# Patient Record
Sex: Male | Born: 1990 | Race: White | Hispanic: No | Marital: Married | Smoking: Current every day smoker
Health system: Southern US, Community
[De-identification: ages and names within clinical notes are randomized; demographics above are authoritative.]

## PROBLEM LIST (undated history)

## (undated) DIAGNOSIS — Z800 Family history of malignant neoplasm of digestive organs: Secondary | ICD-10-CM

## (undated) DIAGNOSIS — Z806 Family history of leukemia: Secondary | ICD-10-CM

## (undated) DIAGNOSIS — F419 Anxiety disorder, unspecified: Secondary | ICD-10-CM

## (undated) DIAGNOSIS — Z8042 Family history of malignant neoplasm of prostate: Secondary | ICD-10-CM

## (undated) HISTORY — DX: Family history of leukemia: Z80.6

## (undated) HISTORY — PX: OTHER SURGICAL HISTORY: SHX169

## (undated) HISTORY — DX: Anxiety disorder, unspecified: F41.9

## (undated) HISTORY — DX: Family history of malignant neoplasm of digestive organs: Z80.0

## (undated) HISTORY — DX: Family history of malignant neoplasm of prostate: Z80.42

---
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
## 2009-05-19 ENCOUNTER — Emergency Department: Payer: Self-pay | Admitting: Emergency Medicine

## 2009-05-22 ENCOUNTER — Emergency Department: Payer: Self-pay

## 2009-08-17 ENCOUNTER — Emergency Department: Payer: Self-pay | Admitting: Internal Medicine

## 2009-10-22 ENCOUNTER — Emergency Department: Payer: Self-pay | Admitting: Emergency Medicine

## 2009-10-29 ENCOUNTER — Emergency Department: Payer: Self-pay | Admitting: Emergency Medicine

## 2010-06-15 ENCOUNTER — Encounter (INDEPENDENT_AMBULATORY_CARE_PROVIDER_SITE_OTHER): Payer: Self-pay | Admitting: *Deleted

## 2010-07-25 ENCOUNTER — Ambulatory Visit: Payer: Self-pay | Admitting: Gastroenterology

## 2010-11-10 ENCOUNTER — Emergency Department: Payer: Self-pay | Admitting: Emergency Medicine

## 2010-11-28 NOTE — Letter (Signed)
Summary: New Patient letter  [facility]  [address] [place], [place] [address]   Phone: [phone]  Fax: [phone]       06/15/2010 MRN: [id number]  [facility] [address] [place], [place]  [address]  Dear Mr. [facility],  Welcome to the Gastroenterology Division at [facility].    You are scheduled to see Dr. Jarold Motto on 07/25/2010 at 10:00AM on the 3rd floor at [facility], [address]  We ask that you try to arrive at our office 15 minutes prior to your appointment time to allow for check-in.  We would like you to complete the enclosed self-administered evaluation form prior to your visit and bring it with you on the day of your appointment.  We will review it with you.  Also, please bring a complete list of all your medications or, if you prefer, bring the medication bottles and we will list them.  Please bring your insurance card so that we may make a copy of it.  If your insurance requires a referral to see a specialist, please bring your referral form from your primary care physician.  Co-payments are due at the time of your visit and may be paid by cash, check or credit card.     Your office visit will consist of a consult with your physician (includes a physical exam), any laboratory testing he/she may order, scheduling of any necessary diagnostic testing (e.g. x-ray, ultrasound, CT-scan), and scheduling of a procedure (e.g. Endoscopy, Colonoscopy) if required.  Please allow enough time on your schedule to allow for any/all of these possibilities.    If you cannot keep your appointment, please call [phone] to cancel or reschedule prior to your appointment date.  This allows [place] the opportunity to schedule an appointment for another patient in need of care.  If you do not cancel or reschedule by 5 p.m. the business day prior to your appointment date, you will be charged a $50.00 late cancellation/no-show fee.    Thank you for  choosing [facility] for your medical needs.  We appreciate the opportunity to care for you.  Please visit [place] at our website  to learn more about our practice.                     Sincerely,                                                             The Gastroenterology Division

## 2013-01-20 ENCOUNTER — Emergency Department: Payer: Self-pay | Admitting: Emergency Medicine

## 2013-01-20 LAB — CBC
HCT: 44 % (ref 40.0–52.0)
MCHC: 35.2 g/dL (ref 32.0–36.0)
MCV: 83 fL (ref 80–100)
Platelet: 261 10*3/uL (ref 150–440)
RBC: 5.28 10*6/uL (ref 4.40–5.90)
RDW: 12.4 % (ref 11.5–14.5)
WBC: 11.6 10*3/uL — ABNORMAL HIGH (ref 3.8–10.6)

## 2013-01-20 LAB — BASIC METABOLIC PANEL
Anion Gap: 4 — ABNORMAL LOW (ref 7–16)
Chloride: 108 mmol/L — ABNORMAL HIGH (ref 98–107)
EGFR (African American): >60
Glucose: 146 mg/dL — ABNORMAL HIGH (ref 65–99)
Osmolality: 280 (ref 275–301)
Potassium: 3.4 mmol/L — ABNORMAL LOW (ref 3.5–5.1)
Sodium: 139 mmol/L (ref 136–145)

## 2013-01-20 LAB — CK TOTAL AND CKMB (NOT AT ARMC): CK-MB: 0.6 ng/mL (ref 0.5–3.6)

## 2013-01-21 LAB — TROPONIN I: Troponin-I: <0.02 ng/mL

## 2013-07-11 ENCOUNTER — Emergency Department: Payer: Self-pay | Admitting: Emergency Medicine

## 2013-07-11 LAB — COMPREHENSIVE METABOLIC PANEL
Anion Gap: 7 (ref 7–16)
BUN: 11 mg/dL (ref 7–18)
Bilirubin,Total: 0.6 mg/dL (ref 0.2–1.0)
Calcium, Total: 8.8 mg/dL (ref 8.5–10.1)
Chloride: 107 mmol/L (ref 98–107)
Creatinine: 0.86 mg/dL (ref 0.60–1.30)
EGFR (African American): >60
EGFR (Non-African Amer.): >60
Glucose: 171 mg/dL — ABNORMAL HIGH (ref 65–99)
Osmolality: 275 (ref 275–301)
Potassium: 3.7 mmol/L (ref 3.5–5.1)
SGOT(AST): 32 U/L (ref 15–37)
SGPT (ALT): 54 U/L (ref 12–78)

## 2013-07-11 LAB — CBC
HCT: 43.6 % (ref 40.0–52.0)
HGB: 15.4 g/dL (ref 13.0–18.0)
MCH: 29.4 pg (ref 26.0–34.0)
MCHC: 35.2 g/dL (ref 32.0–36.0)
RBC: 5.23 10*6/uL (ref 4.40–5.90)

## 2013-07-11 LAB — URINALYSIS, COMPLETE
Bacteria: NONE SEEN
Bilirubin,UR: NEGATIVE
Glucose,UR: NEGATIVE mg/dL (ref 0–75)
Nitrite: NEGATIVE
Ph: 6 (ref 4.5–8.0)
Specific Gravity: 1.026 (ref 1.003–1.030)
Squamous Epithelial: 1
WBC UR: 1 /HPF (ref 0–5)

## 2013-07-12 ENCOUNTER — Emergency Department: Payer: Self-pay | Admitting: Emergency Medicine

## 2013-07-13 LAB — COMPREHENSIVE METABOLIC PANEL
Alkaline Phosphatase: 74 U/L (ref 50–136)
BUN: 9 mg/dL (ref 7–18)
Bilirubin,Total: 1.1 mg/dL — ABNORMAL HIGH (ref 0.2–1.0)
Co2: 26 mmol/L (ref 21–32)
EGFR (Non-African Amer.): >60
Glucose: 84 mg/dL (ref 65–99)
SGOT(AST): 48 U/L — ABNORMAL HIGH (ref 15–37)
SGPT (ALT): 52 U/L (ref 12–78)
Sodium: 135 mmol/L — ABNORMAL LOW (ref 136–145)

## 2013-07-13 LAB — CBC
HCT: 44.2 % (ref 40.0–52.0)
HGB: 15.6 g/dL (ref 13.0–18.0)
MCV: 84 fL (ref 80–100)
RBC: 5.24 10*6/uL (ref 4.40–5.90)
RDW: 12.3 % (ref 11.5–14.5)
WBC: 13.7 10*3/uL — ABNORMAL HIGH (ref 3.8–10.6)

## 2014-01-04 ENCOUNTER — Ambulatory Visit: Payer: Self-pay

## 2014-08-23 ENCOUNTER — Ambulatory Visit: Payer: Self-pay | Admitting: Gastroenterology

## 2014-10-27 ENCOUNTER — Ambulatory Visit: Payer: Self-pay | Admitting: Physician Assistant

## 2015-12-25 IMAGING — RF DG UGI W/ KUB
2 series · 15 of 17 positions shown · non-contrast
Comparison: None.

FLUOROSCOPY TIME:  0 min, 56 seconds.

CLINICAL DATA: Gastroesophageal reflux, relieved with meds

EXAM:
UPPER GI SERIES
TECHNIQUE: A routine upper GI series was performed using thin and high density
barium. Gas-forming crystals and a 12 mm barium tablet were also
administered

[Series 1: fluoro_barium singleshot_bw · 0.17mm/px · 11 of 13 slices shown]
[im 1/13]
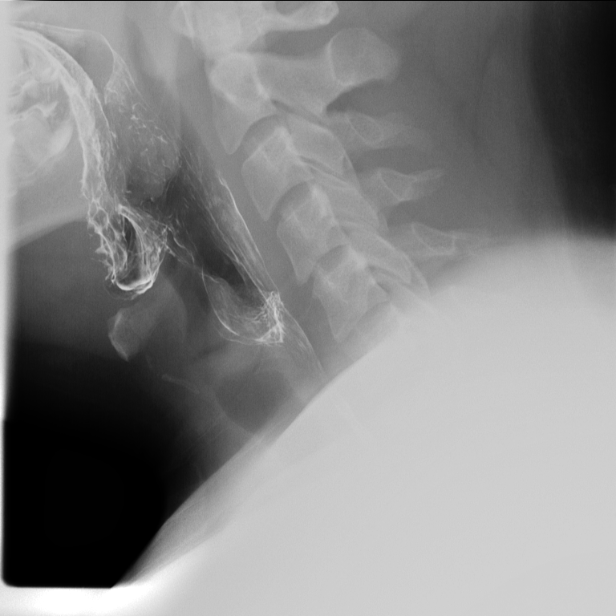
[im 2/13]
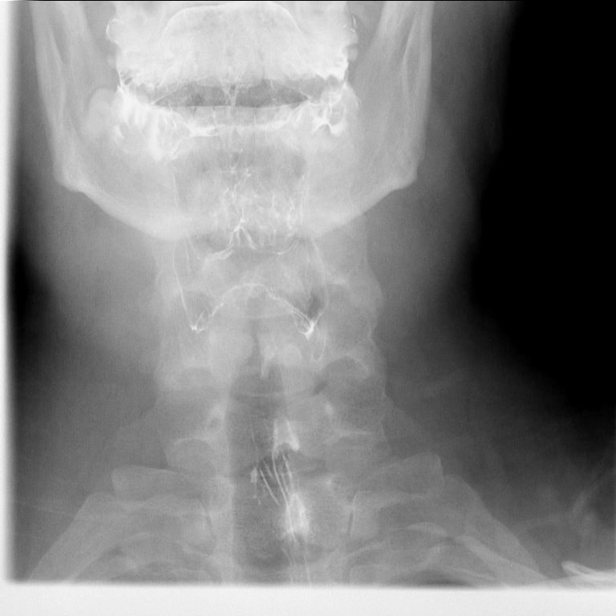
[im 3/13]
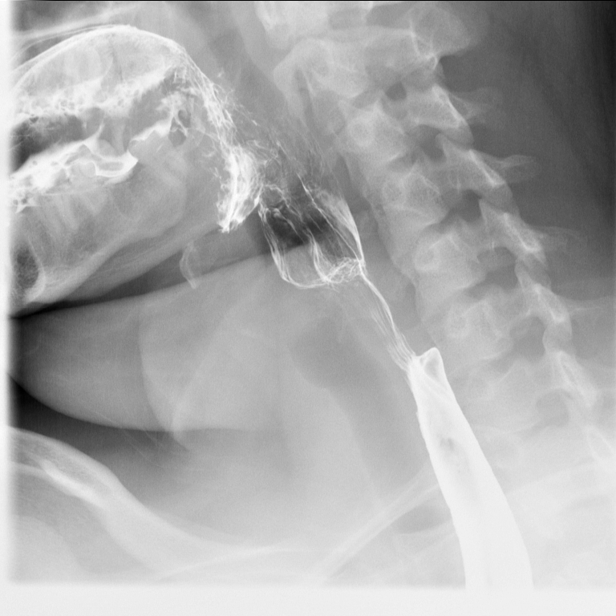
[im 4/13]
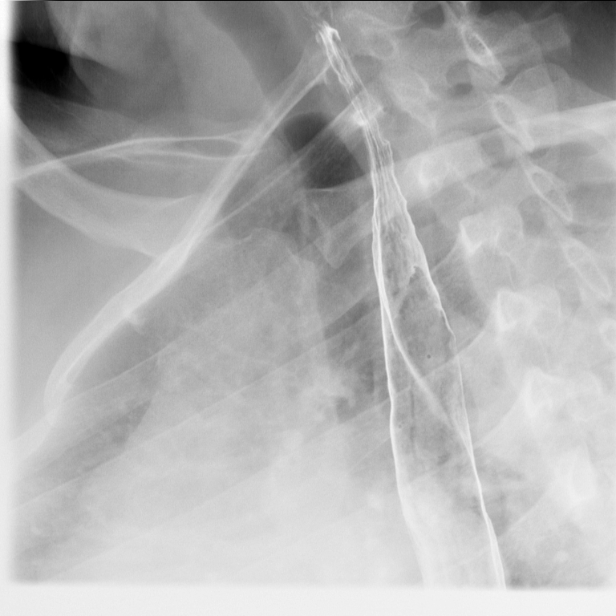
[im 6/13]
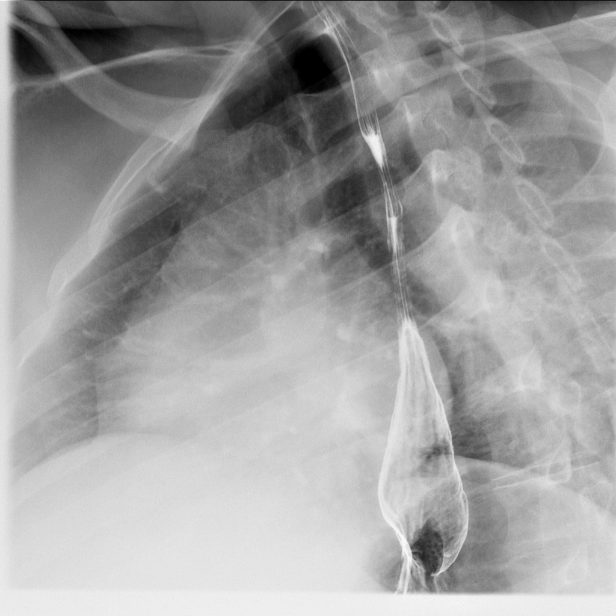
[im 7/13]
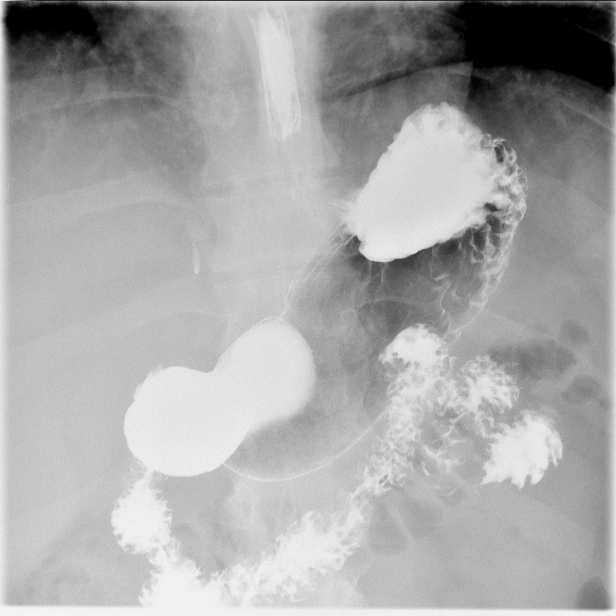
[im 8/13]
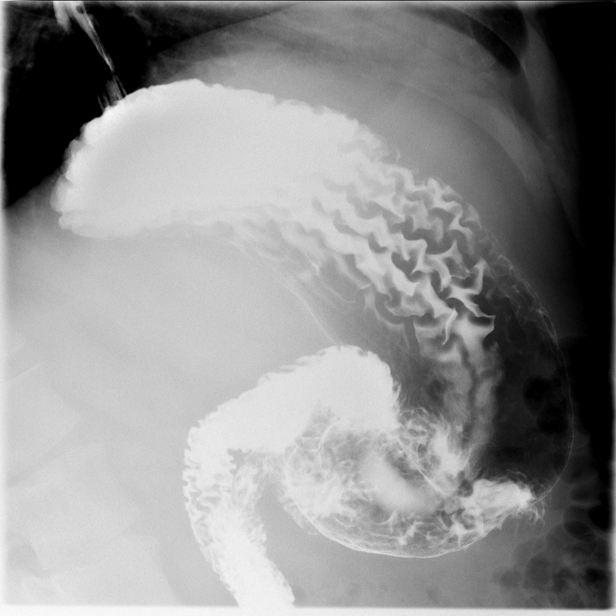
[im 9/13]
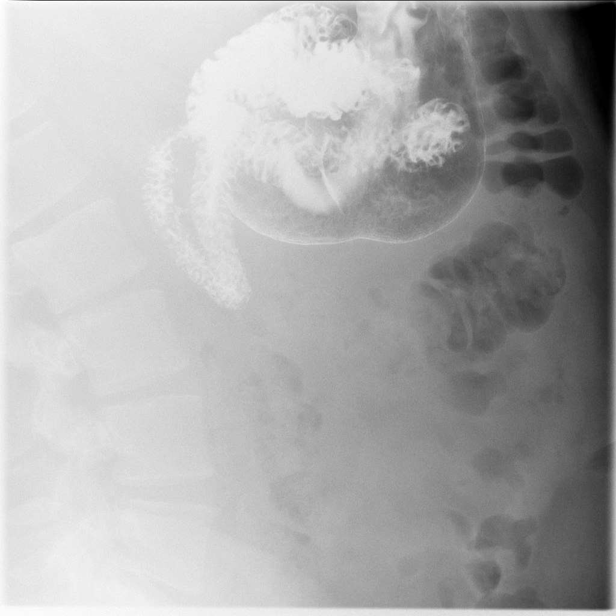
[im 10/13]
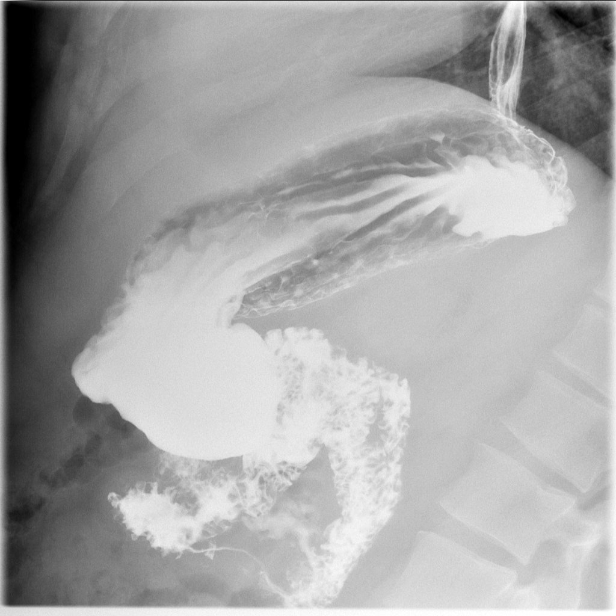
[im 11/13]
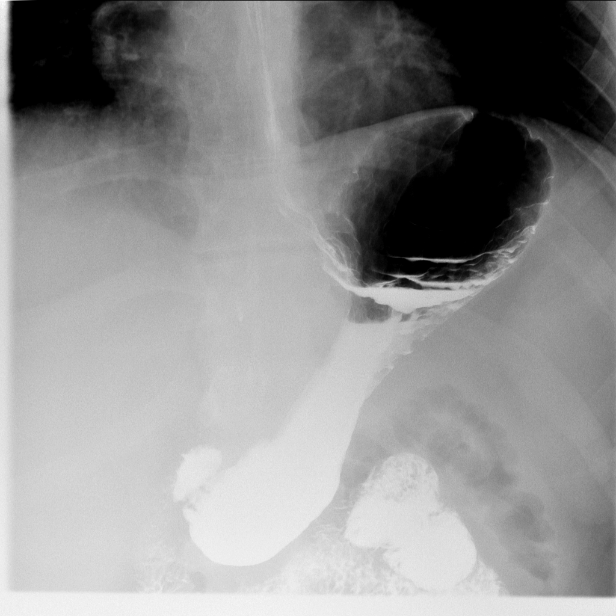
[im 12/13]
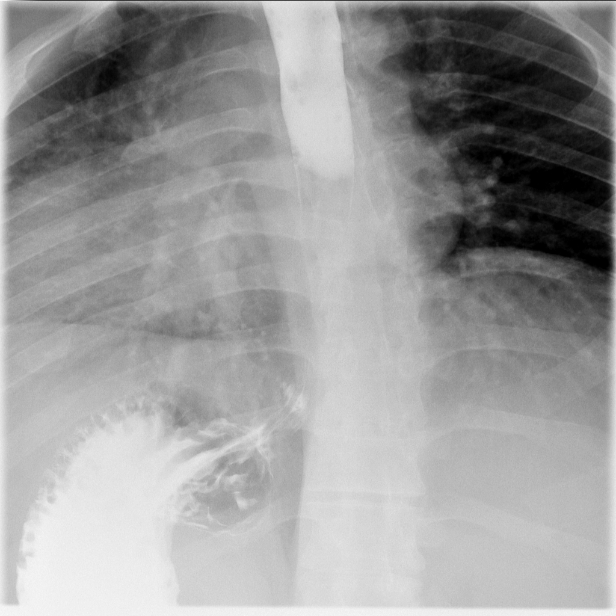

[Series 14: fluoro_barium 2fps_bw · 0.19mm/px · 4 of 13 frames shown]
[frame 1/13]
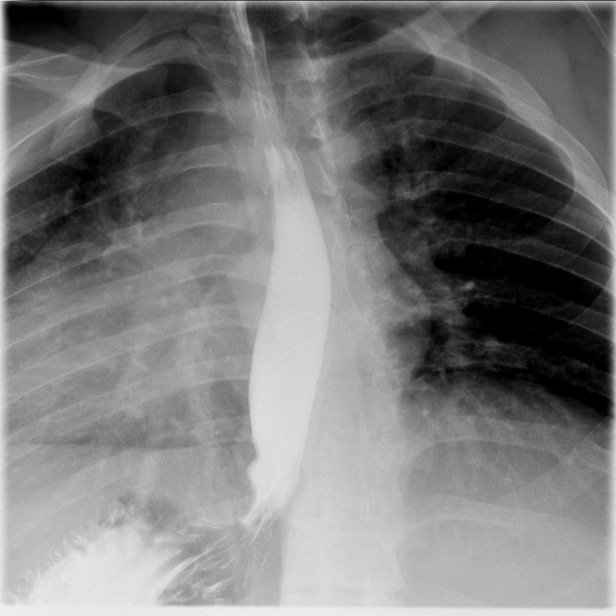
[frame 2/13]
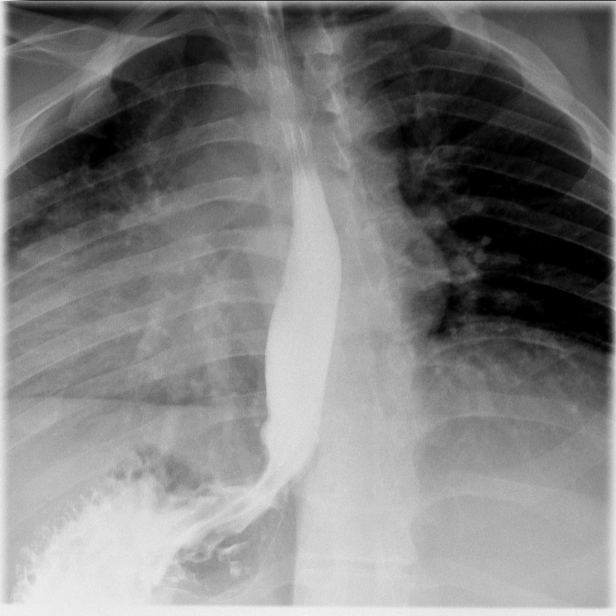
[frame 7/13]
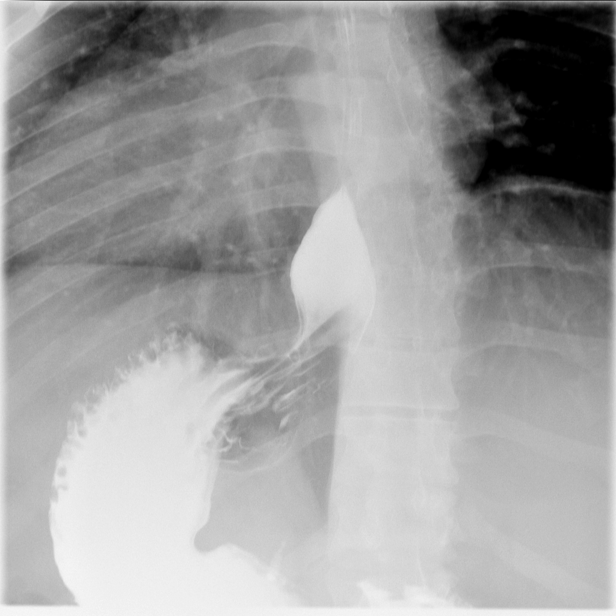
[frame 12/13]
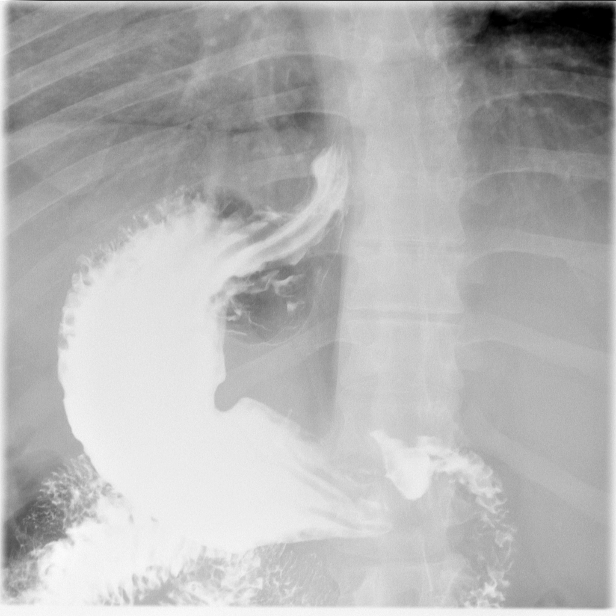

[15 of 17 positions shown; findings below may reference images not displayed]

FINDINGS: The cervical esophagus was normal in a survey fashion. The thoracic
esophagus distended well. No fixed stricture was demonstrated. A
small reducible hiatal hernia was demonstrated. A small amount of
gastroesophageal reflux was also demonstrated.

The stomach distended well. The mucosal fold pattern was normal. The
duodenum bulb and C sweep were normal in appearance. The 12 mm
barium pill passed without difficulty.
IMPRESSION: 1. There is a small reducible hiatal hernia with a small amount of
gastroesophageal reflux. There is no evidence of a stricture or
esophagitis.
2. The stomach and duodenum are normal in appearance.

## 2016-08-17 ENCOUNTER — Ambulatory Visit
Admission: RE | Admit: 2016-08-17 | Discharge: 2016-08-17 | Disposition: A | Discharge status: Home / Self Care | Payer: Managed Care, Other (non HMO) | Source: Ambulatory Visit | Attending: Nurse Practitioner | Admitting: Nurse Practitioner

## 2016-08-17 ENCOUNTER — Other Ambulatory Visit: Payer: Self-pay | Admitting: Nurse Practitioner

## 2016-08-17 DIAGNOSIS — M542 Cervicalgia: Secondary | ICD-10-CM

## 2017-07-26 ENCOUNTER — Ambulatory Visit
Admission: EM | Admit: 2017-07-26 | Discharge: 2017-07-26 | Disposition: A | Discharge status: Home / Self Care | Payer: 59 | Attending: Family Medicine | Admitting: Family Medicine

## 2017-07-26 DIAGNOSIS — R05 Cough: Secondary | ICD-10-CM

## 2017-07-26 DIAGNOSIS — R0602 Shortness of breath: Secondary | ICD-10-CM | POA: Diagnosis not present

## 2017-07-26 DIAGNOSIS — J069 Acute upper respiratory infection, unspecified: Secondary | ICD-10-CM

## 2017-07-26 MED ORDER — BENZONATATE 200 MG PO CAPS: ORAL_CAPSULE | ORAL | 0 refills | Status: DC

## 2017-07-26 MED ORDER — HYDROCOD POLST-CPM POLST ER 10-8 MG/5ML PO SUER: 5.0000 mL | Freq: Two times a day (BID) | ORAL | 0 refills | Status: DC

## 2017-07-26 MED ORDER — ALBUTEROL SULFATE HFA 108 (90 BASE) MCG/ACT IN AERS: 1.0000 | INHALATION_SPRAY | Freq: Four times a day (QID) | RESPIRATORY_TRACT | 0 refills | Status: DC | PRN

## 2017-07-26 NOTE — ED Triage Notes (Signed)
URI sx x 3 days, and worsening. Pt c/o dry cough, dyspnea, nasal congestion, PND, frontal sinus pain.

## 2017-07-26 NOTE — ED Provider Notes (Addendum)
[facility]    CSN: [id number] Arrival date & time: 07/26/17  1836     History   Chief Complaint Chief Complaint  Patient presents with  . Cough    HPI Alfred Gross is a 26 y.o. male.   HPI  26 year old male who presents with a three-day history of upper respiratory symptoms. He has been worsening today. He has had a nonproductive cough and dyspnea worse today nasal congestion breathing at nighttime and frontal sinus pain. He usually catches this from his children. He states invariably will go into bronchitis and his primary care physician usually prescribes antibiotics. I Explained to him that we do not routinely prescribe antibiotics for an upper respiratory infection that will run its course and improve. If it does not he should follow-up with his primary care physician        History reviewed. No pertinent past medical history.  There are no active problems to display for this patient.   History reviewed. No pertinent surgical history.     Home Medications    Prior to Admission medications   Medication Sig Start Date End Date Taking? Authorizing Provider  citalopram (CELEXA) 10 MG tablet Take 10 mg by mouth daily.   Yes [provider]  omeprazole (PRILOSEC) 40 MG capsule Take 40 mg by mouth daily.   Yes [provider]  albuterol (PROVENTIL HFA;VENTOLIN HFA) 108 (90 Base) MCG/ACT inhaler Inhale 1-2 puffs into the lungs every 6 (six) hours as needed for wheezing or shortness of breath. Use with spacer 07/26/17   Lutricia Feil, PA-C  benzonatate (TESSALON) 200 MG capsule Take one cap TID PRN cough 07/26/17   Lutricia Feil, PA-C  chlorpheniramine-HYDROcodone [facility]) 10-8 MG/5ML SUER Take 5 mLs by mouth 2 (two) times daily. 07/26/17   Lutricia Feil, PA-C    Family History Family History  Problem Relation Age of Onset  . Healthy Mother   . Healthy Father     Social History Social History    Substance Use Topics  . Smoking status: Former Smoker    Packs/day: 1.00    Years: 10.00    Quit date: 11/28/2016  . Smokeless tobacco: Never Used  . Alcohol use No     Allergies   Penicillins   Review of Systems Review of Systems  Constitutional: Positive for activity change. Negative for appetite change, chills, fatigue and fever.  HENT: Positive for congestion, postnasal drip and rhinorrhea.   Respiratory: Positive for cough and shortness of breath.   All other systems reviewed and are negative.    Physical Exam Triage Vital Signs ED Triage Vitals  Enc Vitals Group     BP 07/26/17 1927 130/80     Pulse Rate 07/26/17 1927 84     Resp 07/26/17 1927 16     Temp 07/26/17 1927 98.5 F (36.9 C)     Temp Source 07/26/17 1927 Oral     SpO2 07/26/17 1927 98 %     Weight 07/26/17 1928 228 lb (103.4 kg)     Height 07/26/17 1928  (1.753 m)     Head Circumference --      Peak Flow --      Pain Score --      Pain Loc --      Pain Edu? --      Excl. in GC? --    No data found.   Updated Vital Signs BP 130/80 (BP Location: Left Arm)  Pulse 84   Temp 98.5 F (36.9 C) (Oral)   Resp 16   Ht  (1.753 m)   Wt 228 lb (103.4 kg)   SpO2 98%   BMI 33.67 kg/m   Visual Acuity Right Eye Distance:   Left Eye Distance:   Bilateral Distance:    Right Eye Near:   Left Eye Near:    Bilateral Near:     Physical Exam  Constitutional: He is oriented to person, place, and time. He appears well-developed and well-nourished. No distress.  HENT:  Head: Normocephalic.  Right Ear: External ear normal.  Left Ear: External ear normal.  Nose: Nose normal.  Mouth/Throat: Oropharynx is clear and moist. No oropharyngeal exudate.  Eyes: Pupils are equal, round, and reactive to light. Right eye exhibits no discharge. Left eye exhibits no discharge.  Neck: Normal range of motion.  Pulmonary/Chest: Effort normal and breath sounds normal. He has no wheezes. He has no rales. He  exhibits no tenderness.  Musculoskeletal: Normal range of motion.  Lymphadenopathy:    He has no cervical adenopathy.  Neurological: He is alert and oriented to person, place, and time.  Skin: Skin is warm and dry. He is not diaphoretic.  Psychiatric: He has a normal mood and affect. His behavior is normal. Judgment and thought content normal.  Nursing note and vitals reviewed.    UC Treatments / Results  Labs (all labs ordered are listed, but only abnormal results are displayed) Labs Reviewed - No data to display  EKG  EKG Interpretation None       Radiology No results found.  Procedures Procedures (including critical care time)  Medications Ordered in UC Medications - No data to display   Initial Impression / Assessment and Plan / UC Course  I have reviewed the triage vital signs and the nursing notes.  Pertinent labs & imaging results that were available during my care of the patient were reviewed by me and considered in my medical decision making (see chart for details).    Plan: 1. Test/x-ray results and diagnosis reviewed with patient 2. rx as per orders; risks, benefits, potential side effects reviewed with patient 3. Recommend supportive treatment with Flonase nasal spray for 3 weeks to help promote drainage. Also placed him on an albuterol inhaler with spacer for the shortness of breath. Provide him with the [facility] and Tussionex that he may take at nighttime. Advised him that if he does have cough that persists or becomes more productive runs high fevers he should follow-up with his primary care physician 4. F/u prn if symptoms worsen or don't improve    Final Clinical Impressions(s) / UC Diagnoses   Final diagnoses:  Acute upper respiratory infection    New Prescriptions Discharge Medication List as of 07/26/2017  8:06 PM    START taking these medications   Details  albuterol (PROVENTIL HFA;VENTOLIN HFA) 108 (90 Base) MCG/ACT inhaler Inhale  1-2 puffs into the lungs every 6 (six) hours as needed for wheezing or shortness of breath. Use with spacer, Starting Fri 07/26/2017, Normal    benzonatate (TESSALON) 200 MG capsule Take one cap TID PRN cough, Normal    chlorpheniramine-HYDROcodone (TUSSIONEX PENNKINETIC ER) 10-8 MG/5ML SUER Take 5 mLs by mouth 2 (two) times daily., Starting Fri 07/26/2017, Print         Controlled Substance Prescriptions [place] Controlled Substance Registry consulted? Not Applicable   Lutricia Feil, PA-C 07/26/17 2016    Lutricia Feil, PA-C 07/26/17 2019

## 2017-10-16 ENCOUNTER — Ambulatory Visit: Payer: 59 | Admitting: Nurse Practitioner

## 2017-10-16 ENCOUNTER — Encounter: Payer: Self-pay | Admitting: Nurse Practitioner

## 2017-10-16 VITALS — BP 122/61 | HR 113 | Temp 99.5°F | Resp 16 | Ht 69.0 in | Wt 238.4 lb

## 2017-10-16 DIAGNOSIS — R062 Wheezing: Secondary | ICD-10-CM

## 2017-10-16 DIAGNOSIS — J069 Acute upper respiratory infection, unspecified: Secondary | ICD-10-CM

## 2017-10-16 MED ORDER — LEVOFLOXACIN 500 MG PO TABS: 500.0000 mg | ORAL_TABLET | Freq: Every day | ORAL | 0 refills | Status: DC

## 2017-10-16 MED ORDER — PREDNISONE 10 MG PO TABS: ORAL_TABLET | ORAL | 0 refills | Status: DC

## 2017-10-16 NOTE — Progress Notes (Signed)
Subjective:     Patient ID: Alfred CenterChristopher Gross, male   DOB: 30-May-1991, 26 y.o.   MRN: [id number]  The patient c/o nasal congestion, sinus pain, low grade fever, and cough. States that his boss had same symptoms last week and did not stay home at all. Symptoms have been present for a few days. Nothing has helped, and he has taken OTC meds to help.    Current Meds  Medication Sig  . citalopram (CELEXA) 10 MG tablet Take 10 mg by mouth daily.  . clotrimazole-betamethasone (LOTRISONE) cream Apply 1 application topically 2 (two) times daily. Apply extrernaly bid to feet prn  . fluticasone (FLONASE) 50 MCG/ACT nasal spray Place 1 spray into both nostrils daily.  Marland Kitchen. omeprazole (PRILOSEC) 40 MG capsule Take 40 mg by mouth daily.    Review of Systems  Constitutional: Positive for fatigue and fever. Negative for activity change, appetite change and chills.  HENT: Positive for congestion, postnasal drip, rhinorrhea, sinus pressure and sinus pain.   Eyes: Negative.   Respiratory: Positive for cough and chest tightness.   Cardiovascular: Negative.   Gastrointestinal: Negative.   Endocrine: Negative.   Genitourinary: Negative.   Musculoskeletal: Negative.   Neurological: Negative for headaches.  Hematological: Negative.   Psychiatric/Behavioral: Negative.    Today's Vitals   10/16/17 1459  BP: 122/61  Pulse: (!) 113  Resp: 16  Temp: 99.5 F (37.5 C)  SpO2: 94%  Weight: 238 lb 6.4 oz (108.1 kg)  Height: 5\' 9"  (1.753 m)      Objective:   Physical Exam  Constitutional: He is oriented to person, place, and time. He appears well-developed and well-nourished.  HENT:  Head: Normocephalic and atraumatic.  Right Ear: There is swelling and tenderness. Tympanic membrane is bulging.  Left Ear: There is swelling and tenderness. Tympanic membrane is bulging.  Nose: Rhinorrhea present. Right sinus exhibits frontal sinus tenderness. Left sinus exhibits frontal sinus tenderness.  Mouth/Throat:  Uvula is midline. Posterior oropharyngeal erythema present.  Neck: Normal range of motion. Neck supple.  Cardiovascular: Normal rate and regular rhythm.  Pulmonary/Chest: Effort normal. No respiratory distress. He has wheezes.  Abdominal: Soft. There is no tenderness.  Musculoskeletal: Normal range of motion.  Lymphadenopathy:    He has no cervical adenopathy.  Neurological: He is alert and oriented to person, place, and time.  Skin: Skin is warm and dry.  Psychiatric: He has a normal mood and affect. His behavior is normal.       Assessment:     Upper respiratory infection, acute - Plan: levofloxacin (LEVAQUIN) 500 MG tablet, predniSONE (DELTASONE) 10 MG tablet  Wheezing     Plan:     1. URI - start levofloxacin 500mg  daily for 7 days. OTC medications to help relieve symptoms. Work note given for today and tomorrow.  2. Wheezing - rescue inhaler as previously prescribed. Prednisone 10mg  dose pack - take as directed for 6 days.   Follow up as needed and as scheduled

## 2017-10-16 NOTE — Patient Instructions (Addendum)

## 2017-11-11 ENCOUNTER — Other Ambulatory Visit: Payer: Self-pay

## 2017-11-11 MED ORDER — OMEPRAZOLE 40 MG PO CPDR: 40.0000 mg | DELAYED_RELEASE_CAPSULE | Freq: Every day | ORAL | 1 refills | Status: DC

## 2017-11-11 MED ORDER — CITALOPRAM HYDROBROMIDE 10 MG PO TABS: 10.0000 mg | ORAL_TABLET | Freq: Every day | ORAL | 3 refills | Status: DC

## 2017-11-15 ENCOUNTER — Other Ambulatory Visit: Payer: Self-pay

## 2017-11-15 DIAGNOSIS — J069 Acute upper respiratory infection, unspecified: Secondary | ICD-10-CM

## 2017-11-15 MED ORDER — LEVOFLOXACIN 500 MG PO TABS: 500.0000 mg | ORAL_TABLET | Freq: Every day | ORAL | 0 refills | Status: DC

## 2017-11-15 MED ORDER — PREDNISONE 10 MG PO TABS: ORAL_TABLET | ORAL | 0 refills | Status: DC

## 2017-11-15 NOTE — Telephone Encounter (Signed)
Pt called that not feeling still coughing  And sinus infection and as per heather send prednisone and levaquin

## 2018-01-23 ENCOUNTER — Ambulatory Visit: Payer: Self-pay | Admitting: Nurse Practitioner

## 2018-02-17 ENCOUNTER — Ambulatory Visit: Payer: 59 | Admitting: Nurse Practitioner

## 2018-02-17 VITALS — BP 127/78 | HR 86 | Temp 98.3°F | Resp 16 | Ht 69.0 in | Wt 230.4 lb

## 2018-02-17 DIAGNOSIS — R062 Wheezing: Secondary | ICD-10-CM

## 2018-02-17 DIAGNOSIS — R6889 Other general symptoms and signs: Secondary | ICD-10-CM

## 2018-02-17 DIAGNOSIS — J069 Acute upper respiratory infection, unspecified: Secondary | ICD-10-CM

## 2018-02-17 LAB — POCT INFLUENZA A/B
INFLUENZA A, POC: NEGATIVE
INFLUENZA B, POC: NEGATIVE

## 2018-02-17 MED ORDER — ALBUTEROL SULFATE HFA 108 (90 BASE) MCG/ACT IN AERS: 2.0000 | INHALATION_SPRAY | Freq: Four times a day (QID) | RESPIRATORY_TRACT | 2 refills | Status: DC | PRN

## 2018-02-17 MED ORDER — LEVOFLOXACIN 500 MG PO TABS: 500.0000 mg | ORAL_TABLET | Freq: Every day | ORAL | 0 refills | Status: DC

## 2018-02-17 MED ORDER — PREDNISONE 10 MG (21) PO TBPK: ORAL_TABLET | ORAL | 0 refills | Status: DC

## 2018-02-17 NOTE — Progress Notes (Signed)
[facility] [address] [place], [place] [address]  Internal MEDICINE  Office Visit Note  Patient Name: Alfred Gross  [id number]  [id number]  Date of Service: 03/05/2018   Pt is here for a sick visit.  Chief Complaint  Patient presents with  . Nasal Congestion  . Cough    dry hacking cough   . Fever    taking ibuprofen, no fever today,started last thursday  . Ear Pain    left ear      The patient is here for sick visit. He is c/o nasal congestion, sinus pain, low grade fever, and cough. States that his boss had same symptoms last week and did not stay home at all. Symptoms have been present for a few days. Nothing has helped, and he has taken OTC meds to help.    Current Medication:  Outpatient Encounter Medications as of 02/17/2018  Medication Sig  . cetirizine (ZYRTEC) 10 MG tablet Take 10 mg by mouth daily.  . citalopram (CELEXA) 10 MG tablet Take 1 tablet (10 mg total) by mouth daily.  Marland Kitchen albuterol (PROVENTIL HFA;VENTOLIN HFA) 108 (90 Base) MCG/ACT inhaler Inhale 2 puffs into the lungs every 6 (six) hours as needed for wheezing or shortness of breath.  . clotrimazole-betamethasone (LOTRISONE) cream Apply 1 application topically 2 (two) times daily. Apply extrernaly bid to feet prn  . fluticasone (FLONASE) 50 MCG/ACT nasal spray Place 1 spray into both nostrils daily.  Marland Kitchen omeprazole (PRILOSEC) 40 MG capsule Take 1 capsule (40 mg total) by mouth daily.  . [DISCONTINUED] levofloxacin (LEVAQUIN) 500 MG tablet Take 1 tablet (500 mg total) by mouth daily.  . [DISCONTINUED] levofloxacin (LEVAQUIN) 500 MG tablet Take 1 tablet (500 mg total) by mouth daily.  . [DISCONTINUED] predniSONE (DELTASONE) 10 MG tablet 6 day dose pack - take as directed for 6 days (Patient not taking: Reported on 02/17/2018)  . [DISCONTINUED] predniSONE (STERAPRED UNI-PAK 21 TAB) 10 MG (21) TBPK tablet 6 day taper - take by mouth as directed for 6 days   No facility-administered  encounter medications on file as of 02/17/2018.       Medical History: Past Medical History:  Diagnosis Date  . Anxiety      Today's Vitals   02/17/18 1502  BP: 127/78  Pulse: 86  Resp: 16  Temp: 98.3 F (36.8 C)  SpO2: 97%  Weight: 230 lb 6.4 oz (104.5 kg)  Height: 5\' 9"  (1.753 m)    Review of Systems  Constitutional: Positive for chills, fatigue and fever. Negative for activity change and appetite change.  HENT: Positive for congestion, postnasal drip, rhinorrhea, sinus pressure, sinus pain and sore throat.   Eyes: Negative.   Respiratory: Positive for cough and chest tightness.   Cardiovascular: Negative for chest pain and palpitations.  Gastrointestinal: Positive for nausea.  Endocrine: Negative for cold intolerance, heat intolerance, polydipsia, polyphagia and polyuria.  Genitourinary: Negative.   Allergic/Immunologic: Positive for environmental allergies.  Neurological: Positive for headaches.  Hematological: Positive for adenopathy.  Psychiatric/Behavioral: Negative for behavioral problems and dysphoric mood. The patient is not nervous/anxious.     Physical Exam  Constitutional: He is oriented to person, place, and time. He appears well-developed and well-nourished. No distress.  HENT:  Head: Normocephalic and atraumatic.  Right Ear: Tympanic membrane is erythematous and bulging.  Left Ear: Tympanic membrane is erythematous and bulging.  Nose: Rhinorrhea present. Right sinus exhibits maxillary sinus tenderness and frontal sinus tenderness. Left sinus exhibits maxillary sinus tenderness and frontal  sinus tenderness.  Mouth/Throat: Posterior oropharyngeal erythema present. No oropharyngeal exudate.  Eyes: Pupils are equal, round, and reactive to light. EOM are normal.  Neck: Normal range of motion. Neck supple. No JVD present. No tracheal deviation present. No thyromegaly present.  Cardiovascular: Normal rate, regular rhythm and normal heart sounds. Exam reveals  no gallop and no friction rub.  No murmur heard. Pulmonary/Chest: Effort normal and breath sounds normal. No respiratory distress. He has no wheezes. He has no rales. He exhibits no tenderness.  Congested, non-productive cough present.   Abdominal: Soft. Bowel sounds are normal.  Musculoskeletal: Normal range of motion.  Lymphadenopathy:    He has cervical adenopathy.  Neurological: He is alert and oriented to person, place, and time. No cranial nerve deficit.  Skin: Skin is warm and dry. He is not diaphoretic.  Psychiatric: He has a normal mood and affect. His behavior is normal. Judgment and thought content normal.  Nursing note and vitals reviewed.  Assessment/Plan:  1. Flu-like symptoms - POCT Influenza A/B negative today.   2. Upper respiratory infection, acute Treat with levofloxacin 500mg  daily for 10 days. Add prednisone 6 day dose pack. Use OTC medication to alleviate symptoms.   3. Wheezing - albuterol (PROVENTIL HFA;VENTOLIN HFA) 108 (90 Base) MCG/ACT inhaler; Inhale 2 puffs into the lungs every 6 (six) hours as needed for wheezing or shortness of breath.  Dispense: 1 Inhaler; Refill: 2    General Counseling: Medhansh verbalizes understanding of the findings of todays visit and agrees with plan of treatment. I have discussed any further diagnostic evaluation that may be needed or ordered today. We also reviewed his medications today. he has been encouraged to call the office with any questions or concerns that should arise related to todays visit.  Rest and increase fluids. Continue using OTC medication to control symptoms.   This patient was seen by Vincent GrosHeather Karletta Millay, FNP- C in Collaboration with Dr Lyndon CodeFozia M Khan as a part of collaborative care agreement    Orders Placed This Encounter  Procedures  . POCT Influenza A/B    Meds ordered this encounter  Medications  . albuterol (PROVENTIL HFA;VENTOLIN HFA) 108 (90 Base) MCG/ACT inhaler    Sig: Inhale 2 puffs into the  lungs every 6 (six) hours as needed for wheezing or shortness of breath.    Dispense:  1 Inhaler    Refill:  2    Order Specific Question:   Supervising Provider    Answer:   Lyndon CodeKHAN, FOZIA M [[id number]]  . DISCONTD: levofloxacin (LEVAQUIN) 500 MG tablet    Sig: Take 1 tablet (500 mg total) by mouth daily.    Dispense:  10 tablet    Refill:  0    Order Specific Question:   Supervising Provider    Answer:   Lyndon CodeKHAN, FOZIA M [[id number]]  . DISCONTD: predniSONE (STERAPRED UNI-PAK 21 TAB) 10 MG (21) TBPK tablet    Sig: 6 day taper - take by mouth as directed for 6 days    Dispense:  21 tablet    Refill:  0    Order Specific Question:   Supervising Provider    Answer:   Lyndon CodeKHAN, FOZIA M [[id number]]    Time spent: 15 Minutes

## 2018-03-03 ENCOUNTER — Telehealth: Payer: Self-pay

## 2018-03-03 ENCOUNTER — Other Ambulatory Visit: Payer: Self-pay | Admitting: Nurse Practitioner

## 2018-03-03 DIAGNOSIS — K219 Gastro-esophageal reflux disease without esophagitis: Secondary | ICD-10-CM | POA: Insufficient documentation

## 2018-03-03 DIAGNOSIS — Z720 Tobacco use: Secondary | ICD-10-CM | POA: Insufficient documentation

## 2018-03-03 DIAGNOSIS — F419 Anxiety disorder, unspecified: Secondary | ICD-10-CM | POA: Insufficient documentation

## 2018-03-03 DIAGNOSIS — J069 Acute upper respiratory infection, unspecified: Secondary | ICD-10-CM

## 2018-03-03 MED ORDER — LEVOFLOXACIN 500 MG PO TABS: 500.0000 mg | ORAL_TABLET | Freq: Every day | ORAL | 0 refills | Status: DC

## 2018-03-03 MED ORDER — PREDNISONE 10 MG (48) PO TBPK: ORAL_TABLET | ORAL | 0 refills | Status: DC

## 2018-03-03 NOTE — Telephone Encounter (Signed)
Sent note to heather 

## 2018-03-03 NOTE — Progress Notes (Signed)
Still sick after initial round of antibiotics. Repeat levofloxacin  daly for 10 days. Will have him do prednisone, but for 12 days. New prescriptions were sent to [facility]. Continue OTC medication to help with symptoms.

## 2018-03-03 NOTE — Telephone Encounter (Signed)
Still sick after initial round of antibiotics. Repeat levofloxacin 500mg daly for 10 days. Will have him do prednisone, but for 12 days. New prescriptions were sent to [facility]. Continue OTC medication to help with symptoms.  

## 2018-03-04 ENCOUNTER — Telehealth: Payer: Self-pay

## 2018-03-04 NOTE — Telephone Encounter (Signed)
Pt advised that rx for Levaquin and prednisone sent to pharmacy.  dbs

## 2018-03-05 ENCOUNTER — Encounter: Payer: Self-pay | Admitting: Nurse Practitioner

## 2018-03-05 DIAGNOSIS — J069 Acute upper respiratory infection, unspecified: Secondary | ICD-10-CM | POA: Insufficient documentation

## 2018-03-05 DIAGNOSIS — R6889 Other general symptoms and signs: Secondary | ICD-10-CM | POA: Insufficient documentation

## 2018-03-05 DIAGNOSIS — R062 Wheezing: Secondary | ICD-10-CM | POA: Insufficient documentation

## 2018-06-03 ENCOUNTER — Other Ambulatory Visit: Payer: Self-pay

## 2018-06-03 MED ORDER — OMEPRAZOLE 40 MG PO CPDR: 40.0000 mg | DELAYED_RELEASE_CAPSULE | Freq: Every day | ORAL | 1 refills | Status: DC

## 2018-06-17 ENCOUNTER — Encounter: Payer: Self-pay | Admitting: Adult Health

## 2018-06-17 ENCOUNTER — Ambulatory Visit: Payer: Managed Care, Other (non HMO) | Admitting: Adult Health

## 2018-06-17 VITALS — BP 112/78 | HR 74 | Temp 98.8°F | Resp 16 | Ht 69.0 in | Wt 235.0 lb

## 2018-06-17 DIAGNOSIS — F419 Anxiety disorder, unspecified: Secondary | ICD-10-CM

## 2018-06-17 DIAGNOSIS — J069 Acute upper respiratory infection, unspecified: Secondary | ICD-10-CM

## 2018-06-17 DIAGNOSIS — J0100 Acute maxillary sinusitis, unspecified: Secondary | ICD-10-CM

## 2018-06-17 MED ORDER — AZITHROMYCIN 250 MG PO TABS: ORAL_TABLET | ORAL | 0 refills | Status: DC

## 2018-06-17 NOTE — Patient Instructions (Signed)

## 2018-06-17 NOTE — Progress Notes (Signed)
[facility] [address] [place], [place] [address]  Internal MEDICINE  Office Visit Note  Patient Name: Alfred Gross  1991  [id number]  Date of Service: 07/03/2018  Chief Complaint  Patient presents with  . Sinusitis  . Cough     HPI Pt is here for a sick visit. He reports sob, coughing with yellow colored sputum.  He reports mild body aches, and headaches with sinus pressure for about 5 days.  He denies fever or chills.  He reports his kids and wife have been sick, and now he is also. He reports using Flonase and zyrtec with some relief.     Current Medication:  Outpatient Encounter Medications as of 06/17/2018  Medication Sig  . albuterol (PROVENTIL HFA;VENTOLIN HFA) 108 (90 Base) MCG/ACT inhaler Inhale 2 puffs into the lungs every 6 (six) hours as needed for wheezing or shortness of breath.  . cetirizine (ZYRTEC) 10 MG tablet Take 10 mg by mouth daily.  . clotrimazole-betamethasone (LOTRISONE) cream Apply 1 application topically 2 (two) times daily. Apply extrernaly bid to feet prn  . omeprazole (PRILOSEC) 40 MG capsule Take 1 capsule (40 mg total) by mouth daily.  . [DISCONTINUED] citalopram (CELEXA) 10 MG tablet Take 1 tablet (10 mg total) by mouth daily.  Marland Kitchen. azithromycin (ZITHROMAX) 250 MG tablet Take 2 Tabs on day one, and 1 tab on days 2-5.  . fluticasone (FLONASE) 50 MCG/ACT nasal spray Place 1 spray into both nostrils daily.  . [DISCONTINUED] levofloxacin (LEVAQUIN) 500 MG tablet Take 1 tablet (500 mg total) by mouth daily. (Patient not taking: Reported on 06/17/2018)  . [DISCONTINUED] predniSONE (STERAPRED UNI-PAK 48 TAB) 10 MG (48) TBPK tablet 12 day taper - take by mout as directed for 12 days. (Patient not taking: Reported on 06/17/2018)   No facility-administered encounter medications on file as of 06/17/2018.       Medical History: Past Medical History:  Diagnosis Date  . Anxiety      Vital Signs: BP 112/78   Pulse 74   Temp  98.8 F (37.1 C)   Resp 16   Ht 5\' 9"  (1.753 m)   Wt 235 lb (106.6 kg)   SpO2 95%   BMI 34.70 kg/m    Review of Systems  Constitutional: Negative.  Negative for chills, fatigue and unexpected weight change.  HENT: Negative.  Negative for congestion, rhinorrhea, sneezing and sore throat.   Eyes: Negative for redness.  Respiratory: Negative.  Negative for cough, chest tightness and shortness of breath.   Cardiovascular: Negative.  Negative for chest pain and palpitations.  Gastrointestinal: Negative.  Negative for abdominal pain, constipation, diarrhea, nausea and vomiting.  Endocrine: Negative.   Genitourinary: Negative.  Negative for dysuria and frequency.  Musculoskeletal: Negative.  Negative for arthralgias, back pain, joint swelling and neck pain.  Skin: Negative.  Negative for rash.  Allergic/Immunologic: Negative.   Neurological: Negative.  Negative for tremors and numbness.  Hematological: Negative for adenopathy. Does not bruise/bleed easily.  Psychiatric/Behavioral: Negative.  Negative for behavioral problems, sleep disturbance and suicidal ideas. The patient is not nervous/anxious.     Physical Exam  Constitutional: He is oriented to person, place, and time. He appears well-developed and well-nourished. No distress.  HENT:  Head: Normocephalic and atraumatic.  Mouth/Throat: Oropharynx is clear and moist. No oropharyngeal exudate.  Eyes: Pupils are equal, round, and reactive to light. EOM are normal.  Neck: Normal range of motion. Neck supple. No JVD present. No tracheal deviation present. No thyromegaly  present.  Cardiovascular: Normal rate, regular rhythm and normal heart sounds. Exam reveals no gallop and no friction rub.  No murmur heard. Pulmonary/Chest: Effort normal. No respiratory distress. He has wheezes. He has no rales. He exhibits no tenderness.  Abdominal: Soft. There is no tenderness. There is no guarding.  Musculoskeletal: Normal range of motion.   Lymphadenopathy:    He has no cervical adenopathy.  Neurological: He is alert and oriented to person, place, and time. No cranial nerve deficit.  Skin: Skin is warm and dry. He is not diaphoretic.  Psychiatric: He has a normal mood and affect. His behavior is normal. Judgment and thought content normal.  Nursing note and vitals reviewed.  Assessment/Plan: 1. Upper respiratory tract infection, unspecified type Encouraged patient to continues allergy medications.  Take Z-pak as directed.  And use Proair inhaler as needed.  - azithromycin (ZITHROMAX) 250 MG tablet; Take 2 Tabs on day one, and 1 tab on days 2-5.  Dispense: 6 tablet; Refill: 0  2. Acute non-recurrent maxillary sinusitis Appears to be resolving at this time.   3. Anxiety Stable, continue medication.    General Counseling: Alfred Gross verbalizes understanding of the findings of todays visit and agrees with plan of treatment. I have discussed any further diagnostic evaluation that may be needed or ordered today. We also reviewed his medications today. he has been encouraged to call the office with any questions or concerns that should arise related to todays visit.  Meds ordered this encounter  Medications  . azithromycin (ZITHROMAX) 250 MG tablet    Sig: Take 2 Tabs on day one, and 1 tab on days 2-5.    Dispense:  6 tablet    Refill:  0    Time spent: 25 Minutes  This patient was seen by Blima LedgerAdam Averi Kilty AGNP-C in Collaboration with Dr Lyndon CodeFozia M Khan as a part of collaborative care agreement

## 2018-07-01 ENCOUNTER — Other Ambulatory Visit: Payer: Self-pay

## 2018-07-01 MED ORDER — CITALOPRAM HYDROBROMIDE 10 MG PO TABS: 10.0000 mg | ORAL_TABLET | Freq: Every day | ORAL | 0 refills | Status: DC

## 2018-10-13 ENCOUNTER — Other Ambulatory Visit: Payer: Self-pay

## 2018-10-13 MED ORDER — CITALOPRAM HYDROBROMIDE 10 MG PO TABS: 10.0000 mg | ORAL_TABLET | Freq: Every day | ORAL | 0 refills | Status: DC

## 2018-10-13 NOTE — Telephone Encounter (Signed)
Spoke with pt that we renew med he need appt for med refills for next time and he said he will call back after christmas

## 2018-10-24 ENCOUNTER — Ambulatory Visit: Payer: Managed Care, Other (non HMO) | Admitting: Adult Health

## 2018-10-24 ENCOUNTER — Encounter: Payer: Self-pay | Admitting: Adult Health

## 2018-10-24 VITALS — BP 131/82 | HR 118 | Temp 100.8°F | Resp 16 | Ht 69.0 in | Wt 249.8 lb

## 2018-10-24 DIAGNOSIS — K219 Gastro-esophageal reflux disease without esophagitis: Secondary | ICD-10-CM

## 2018-10-24 DIAGNOSIS — F419 Anxiety disorder, unspecified: Secondary | ICD-10-CM

## 2018-10-24 DIAGNOSIS — J069 Acute upper respiratory infection, unspecified: Secondary | ICD-10-CM

## 2018-10-24 DIAGNOSIS — R6889 Other general symptoms and signs: Secondary | ICD-10-CM

## 2018-10-24 LAB — POCT INFLUENZA A/B
Influenza A, POC: NEGATIVE
Influenza B, POC: NEGATIVE

## 2018-10-24 MED ORDER — CITALOPRAM HYDROBROMIDE 10 MG PO TABS: 10.0000 mg | ORAL_TABLET | Freq: Every day | ORAL | 0 refills | Status: DC

## 2018-10-24 MED ORDER — OMEPRAZOLE 40 MG PO CPDR: 40.0000 mg | DELAYED_RELEASE_CAPSULE | Freq: Every day | ORAL | 1 refills | Status: DC

## 2018-10-24 MED ORDER — DOXYCYCLINE HYCLATE 100 MG PO CAPS: 100.0000 mg | ORAL_CAPSULE | Freq: Two times a day (BID) | ORAL | 0 refills | Status: DC

## 2018-10-24 NOTE — Progress Notes (Signed)
[facility] [address] [place], [place] [address]  Internal MEDICINE  Office Visit Note  Patient Name: Alfred Gross  [birth year]  [id number]  Date of Service: 10/28/2018  Chief Complaint  Patient presents with  . Cough    chest congestion  . Generalized Body Aches     HPI Pt is here for a sick visit. Tired, fatigue, chest congestion.  He reports some generalized body aches also.  He reports that he has been coughing so much that his sides are sore.  He is using over-the-counter medications with mild relief.  He denies any sick contacts at this time.     Current Medication:  Outpatient Encounter Medications as of 10/24/2018  Medication Sig  . albuterol (PROVENTIL HFA;VENTOLIN HFA) 108 (90 Base) MCG/ACT inhaler Inhale 2 puffs into the lungs every 6 (six) hours as needed for wheezing or shortness of breath.  . cetirizine (ZYRTEC) 10 MG tablet Take 10 mg by mouth daily.  . citalopram (CELEXA) 10 MG tablet Take 1 tablet (10 mg total) by mouth daily.  . clotrimazole-betamethasone (LOTRISONE) cream Apply 1 application topically 2 (two) times daily. Apply extrernaly bid to feet prn  . fluticasone (FLONASE) 50 MCG/ACT nasal spray Place 1 spray into both nostrils daily.  Marland Kitchen. omeprazole (PRILOSEC) 40 MG capsule Take 1 capsule (40 mg total) by mouth daily.  . [DISCONTINUED] azithromycin (ZITHROMAX) 250 MG tablet Take 2 Tabs on day one, and 1 tab on days 2-5.  . [DISCONTINUED] citalopram (CELEXA) 10 MG tablet Take 1 tablet (10 mg total) by mouth daily.  . [DISCONTINUED] doxycycline (VIBRAMYCIN) 100 MG capsule Take 1 capsule (100 mg total) by mouth 2 (two) times daily.  . [DISCONTINUED] omeprazole (PRILOSEC) 40 MG capsule Take 1 capsule (40 mg total) by mouth daily.   No facility-administered encounter medications on file as of 10/24/2018.       Medical History: Past Medical History:  Diagnosis Date  . Anxiety      Vital Signs: BP 131/82   Pulse (!) 118    Temp (!) 100.8 F (38.2 C)   Resp 16   Ht 5\' 9"  (1.753 m)   Wt 249 lb 12.8 oz (113.3 kg)   SpO2 96%   BMI 36.89 kg/m    Review of Systems  Constitutional: Negative.  Negative for chills, fatigue and unexpected weight change.  HENT: Negative.  Negative for congestion, rhinorrhea, sneezing and sore throat.   Eyes: Negative for redness.  Respiratory: Negative.  Negative for cough, chest tightness and shortness of breath.   Cardiovascular: Negative.  Negative for chest pain and palpitations.  Gastrointestinal: Negative.  Negative for abdominal pain, constipation, diarrhea, nausea and vomiting.  Endocrine: Negative.   Genitourinary: Negative.  Negative for dysuria and frequency.  Musculoskeletal: Negative.  Negative for arthralgias, back pain, joint swelling and neck pain.  Skin: Negative.  Negative for rash.  Allergic/Immunologic: Negative.   Neurological: Negative.  Negative for tremors and numbness.  Hematological: Negative for adenopathy. Does not bruise/bleed easily.  Psychiatric/Behavioral: Negative.  Negative for behavioral problems, sleep disturbance and suicidal ideas. The patient is not nervous/anxious.     Physical Exam Vitals signs and nursing note reviewed.  Constitutional:      General: He is not in acute distress.    Appearance: He is well-developed. He is not diaphoretic.  HENT:     Head: Normocephalic and atraumatic.     Mouth/Throat:     Pharynx: No oropharyngeal exudate.  Eyes:     Pupils:  Pupils are equal, round, and reactive to light.  Neck:     Musculoskeletal: Normal range of motion and neck supple.     Thyroid: No thyromegaly.     Vascular: No JVD.     Trachea: No tracheal deviation.  Cardiovascular:     Rate and Rhythm: Normal rate and regular rhythm.     Heart sounds: Normal heart sounds. No murmur. No friction rub. No gallop.   Pulmonary:     Effort: Pulmonary effort is normal. No respiratory distress.     Breath sounds: Normal breath sounds. No  wheezing or rales.  Chest:     Chest wall: No tenderness.  Abdominal:     Palpations: Abdomen is soft.     Tenderness: There is no abdominal tenderness. There is no guarding.  Musculoskeletal: Normal range of motion.  Lymphadenopathy:     Cervical: No cervical adenopathy.  Skin:    General: Skin is warm and dry.  Neurological:     Mental Status: He is alert and oriented to person, place, and time.     Cranial Nerves: No cranial nerve deficit.  Psychiatric:        Behavior: Behavior normal.        Thought Content: Thought content normal.        Judgment: Judgment normal.    Assessment/Plan: 1. Upper respiratory tract infection, unspecified type Pt prescribed Doxycycline for URI.    2. Flu-like symptoms Negative for Flu A and B.  - POCT Influenza A/B  3. Gastroesophageal reflux disease without esophagitis Refilled patient's Prilosec at his request. - omeprazole (PRILOSEC) 40 MG capsule; Take 1 capsule (40 mg total) by mouth daily.  Dispense: 90 capsule; Refill: 1  4. Anxiety Refill patient's Celexa at his request. - citalopram (CELEXA) 10 MG tablet; Take 1 tablet (10 mg total) by mouth daily.  Dispense: 90 tablet; Refill: 0  General Counseling: Andrian verbalizes understanding of the findings of todays visit and agrees with plan of treatment. I have discussed any further diagnostic evaluation that may be needed or ordered today. We also reviewed his medications today. he has been encouraged to call the office with any questions or concerns that should arise related to todays visit.   Orders Placed This Encounter  Procedures  . POCT Influenza A/B    Meds ordered this encounter  Medications  . omeprazole (PRILOSEC) 40 MG capsule    Sig: Take 1 capsule (40 mg total) by mouth daily.    Dispense:  90 capsule    Refill:  1  . citalopram (CELEXA) 10 MG tablet    Sig: Take 1 tablet (10 mg total) by mouth daily.    Dispense:  90 tablet    Refill:  0    Pt need appt for  next refills  . DISCONTD: doxycycline (VIBRAMYCIN) 100 MG capsule    Sig: Take 1 capsule (100 mg total) by mouth 2 (two) times daily.    Dispense:  20 capsule    Refill:  0    Time spent: 25 Minutes  This patient was seen by Blima LedgerAdam Jyren Cerasoli AGNP-C in Collaboration with Dr Lyndon CodeFozia M Khan as a part of collaborative care agreement.  Johnna AcostaAdam J. Ceaser Ebeling AGNP-C Internal Medicine

## 2018-10-24 NOTE — Patient Instructions (Signed)

## 2018-10-27 ENCOUNTER — Other Ambulatory Visit: Payer: Self-pay

## 2018-10-27 ENCOUNTER — Telehealth: Payer: Self-pay

## 2018-10-27 MED ORDER — AZITHROMYCIN 250 MG PO TABS: ORAL_TABLET | ORAL | 0 refills | Status: DC

## 2018-10-27 NOTE — Telephone Encounter (Signed)
PT ADVISED THAT STOPPED DOXYCYCLINE AND START ZPAK FROM TOMORROW  AND SHE DON'T FEEL BETTER MAY BE WE CAN DO 1 MORE ROUND AS PER ADAM

## 2018-10-31 ENCOUNTER — Ambulatory Visit: Payer: Managed Care, Other (non HMO) | Admitting: Adult Health

## 2018-10-31 ENCOUNTER — Encounter: Payer: Self-pay | Admitting: Adult Health

## 2018-10-31 VITALS — BP 128/90 | HR 88 | Resp 16 | Ht 69.0 in | Wt 231.8 lb

## 2018-10-31 DIAGNOSIS — F419 Anxiety disorder, unspecified: Secondary | ICD-10-CM

## 2018-10-31 DIAGNOSIS — Z789 Other specified health status: Secondary | ICD-10-CM

## 2018-10-31 DIAGNOSIS — J069 Acute upper respiratory infection, unspecified: Secondary | ICD-10-CM

## 2018-10-31 NOTE — Progress Notes (Signed)
[facility] [address] [place], [place] [address]  Internal MEDICINE  Office Visit Note  Patient Name: Alfred Gross  [id number]  [id number]  Date of Service: 11/01/2018  Chief Complaint  Patient presents with  . Dizziness    pt has been staying lightheaded since Monday  . Nausea    started after taking the doxycycline, has been feeling lightheaded and headache sinice taking the medication     HPI Pt is here for a sick visit.  Patient was seen on 10/24/2018 for upper respiratory infection.  He was started on doxycycline and reports that his symptoms he took the third dose doxycycline his head began to hurt.  He has been lightheaded and have intermittent headaches since starting that medication.  He called the office and was stopped and placed on a Z-Pak on 10/27/2018.  He presents today claiming of continued lightheadedness.  The headache has resolved.  Careful inspection of his symptoms and reviewing drug interactions.  It is likely that his Celexa is intensified by taking azithromycin.  Patient reports he does feel like he did when he started the Celexa.     Current Medication:  Outpatient Encounter Medications as of 10/31/2018  Medication Sig  . albuterol (PROVENTIL HFA;VENTOLIN HFA) 108 (90 Base) MCG/ACT inhaler Inhale 2 puffs into the lungs every 6 (six) hours as needed for wheezing or shortness of breath.  Marland Kitchen azithromycin (ZITHROMAX Z-PAK) 250 MG tablet USE AS DIRECTED FOR 5 DAYS  . cetirizine (ZYRTEC) 10 MG tablet Take 10 mg by mouth daily.  . citalopram (CELEXA) 10 MG tablet Take 1 tablet (10 mg total) by mouth daily.  . clotrimazole-betamethasone (LOTRISONE) cream Apply 1 application topically 2 (two) times daily. Apply extrernaly bid to feet prn  . fluticasone (FLONASE) 50 MCG/ACT nasal spray Place 1 spray into both nostrils daily.  Marland Kitchen omeprazole (PRILOSEC) 40 MG capsule Take 1 capsule (40 mg total) by mouth daily.   No facility-administered encounter  medications on file as of 10/31/2018.       Medical History: Past Medical History:  Diagnosis Date  . Anxiety      Vital Signs: BP 128/90 (BP Location: Left Arm, Patient Position: Sitting, Cuff Size: Large)   Pulse 88   Resp 16   Ht 5\' 9"  (1.753 m)   Wt 231 lb 12.8 oz (105.1 kg)   SpO2 97%   BMI 34.23 kg/m    Review of Systems  Constitutional: Negative.  Negative for chills, fatigue and unexpected weight change.  HENT: Negative.  Negative for congestion, rhinorrhea, sneezing and sore throat.   Eyes: Negative for redness.  Respiratory: Negative.  Negative for cough, chest tightness and shortness of breath.   Cardiovascular: Negative.  Negative for chest pain and palpitations.  Gastrointestinal: Negative.  Negative for abdominal pain, constipation, diarrhea, nausea and vomiting.  Endocrine: Negative.   Genitourinary: Negative.  Negative for dysuria and frequency.  Musculoskeletal: Negative.  Negative for arthralgias, back pain, joint swelling and neck pain.  Skin: Negative.  Negative for rash.  Allergic/Immunologic: Negative.   Neurological: Negative.  Negative for tremors and numbness.  Hematological: Negative for adenopathy. Does not bruise/bleed easily.  Psychiatric/Behavioral: Negative.  Negative for behavioral problems, sleep disturbance and suicidal ideas. The patient is not nervous/anxious.     Physical Exam Vitals signs and nursing note reviewed.  Constitutional:      General: He is not in acute distress.    Appearance: He is well-developed. He is not diaphoretic.  HENT:  Head: Normocephalic and atraumatic.     Mouth/Throat:     Pharynx: No oropharyngeal exudate.  Eyes:     Pupils: Pupils are equal, round, and reactive to light.  Neck:     Musculoskeletal: Normal range of motion and neck supple.     Thyroid: No thyromegaly.     Vascular: No JVD.     Trachea: No tracheal deviation.  Cardiovascular:     Rate and Rhythm: Normal rate and regular rhythm.      Heart sounds: Normal heart sounds. No murmur. No friction rub. No gallop.   Pulmonary:     Effort: Pulmonary effort is normal. No respiratory distress.     Breath sounds: Normal breath sounds. No wheezing or rales.  Chest:     Chest wall: No tenderness.  Abdominal:     Palpations: Abdomen is soft.     Tenderness: There is no abdominal tenderness. There is no guarding.  Musculoskeletal: Normal range of motion.  Lymphadenopathy:     Cervical: No cervical adenopathy.  Skin:    General: Skin is warm and dry.  Neurological:     Mental Status: He is alert and oriented to person, place, and time.     Cranial Nerves: No cranial nerve deficit.  Psychiatric:        Behavior: Behavior normal.        Thought Content: Thought content normal.        Judgment: Judgment normal.    Assessment/Plan: 1. Interaction, drug Patient's Celexa and azithromycin likely causing symptoms due to reaction.  Patient is instructed to take half dose of Celexa for the next 2 days as his azithromycin is already complete.  He will then resume his normal dose.  I have instructed the patient to return to clinic or call if symptoms do not resolve.  2. Upper respiratory tract infection, unspecified type Appears to be resolving.  Patient denies any cough or congestion at this time.  No fevers noted.  3. Anxiety Stable at this time.  Patient denies any SI or HI.  He will continue to take Celexa as above.  General Counseling: danne hemple understanding of the findings of todays visit and agrees with plan of treatment. I have discussed any further diagnostic evaluation that may be needed or ordered today. We also reviewed his medications today. he has been encouraged to call the office with any questions or concerns that should arise related to todays visit.   No orders of the defined types were placed in this encounter.   No orders of the defined types were placed in this encounter.   Time spent: 25  Minutes  This patient was seen by Blima Ledger AGNP-C in Collaboration with Dr Lyndon Code as a part of collaborative care agreement.  Johnna Acosta AGNP-C Internal Medicine

## 2018-11-05 ENCOUNTER — Encounter: Payer: Self-pay | Admitting: Adult Health

## 2018-12-09 ENCOUNTER — Telehealth: Payer: Self-pay

## 2018-12-09 ENCOUNTER — Other Ambulatory Visit: Payer: Self-pay

## 2018-12-09 DIAGNOSIS — F419 Anxiety disorder, unspecified: Secondary | ICD-10-CM

## 2018-12-09 MED ORDER — CITALOPRAM HYDROBROMIDE 10 MG PO TABS: 10.0000 mg | ORAL_TABLET | Freq: Every day | ORAL | 0 refills | Status: DC

## 2018-12-09 NOTE — Telephone Encounter (Signed)
Spoke with pt advised he need follow up appt med refills

## 2019-01-08 ENCOUNTER — Ambulatory Visit: Payer: Managed Care, Other (non HMO) | Admitting: Adult Health

## 2019-01-08 ENCOUNTER — Encounter: Payer: Self-pay | Admitting: Adult Health

## 2019-01-08 ENCOUNTER — Other Ambulatory Visit: Payer: Self-pay

## 2019-01-08 VITALS — BP 116/78 | HR 96 | Temp 98.6°F | Resp 16 | Ht 69.0 in | Wt 240.0 lb

## 2019-01-08 DIAGNOSIS — F419 Anxiety disorder, unspecified: Secondary | ICD-10-CM

## 2019-01-08 DIAGNOSIS — K591 Functional diarrhea: Secondary | ICD-10-CM

## 2019-01-08 DIAGNOSIS — K219 Gastro-esophageal reflux disease without esophagitis: Secondary | ICD-10-CM

## 2019-01-08 MED ORDER — CITALOPRAM HYDROBROMIDE 10 MG PO TABS: 10.0000 mg | ORAL_TABLET | Freq: Every day | ORAL | 0 refills | Status: DC

## 2019-01-08 MED ORDER — METRONIDAZOLE 500 MG PO TABS: 500.0000 mg | ORAL_TABLET | Freq: Three times a day (TID) | ORAL | 0 refills | Status: DC

## 2019-01-08 NOTE — Progress Notes (Signed)
[facility] [address] [place], [place] [address]  Internal MEDICINE  Office Visit Note  Patient Name: Alfred Gross  [id number]  [id number]  Date of Service: 01/08/2019  Chief Complaint  Patient presents with  . Diarrhea    since last Tuesday the 3rd . pale yellow   . Fatigue     HPI Pt is here for a sick visit. Pt reports 9 days of diarrhea, fatigue and generally feeling bad. He reports trying to take some probiotics with no luck.  He reports 5-6 episodes of diarrhea daily.  He has been able to work, however the diarrhea is not resolving. He reports that his boss currently has similar symptoms, but has not seen a provider about his.  He denies any fever, sob, chest pain or overt abdominal pain.       Current Medication:  Outpatient Encounter Medications as of 01/08/2019  Medication Sig  . albuterol (PROVENTIL HFA;VENTOLIN HFA) 108 (90 Base) MCG/ACT inhaler Inhale 2 puffs into the lungs every 6 (six) hours as needed for wheezing or shortness of breath.  . cetirizine (ZYRTEC) 10 MG tablet Take 10 mg by mouth daily.  . citalopram (CELEXA) 10 MG tablet Take 1 tablet (10 mg total) by mouth daily.  . clotrimazole-betamethasone (LOTRISONE) cream Apply 1 application topically 2 (two) times daily. Apply extrernaly bid to feet prn  . fluticasone (FLONASE) 50 MCG/ACT nasal spray Place 1 spray into both nostrils daily.  Marland Kitchen omeprazole (PRILOSEC) 40 MG capsule Take 1 capsule (40 mg total) by mouth daily.  . [DISCONTINUED] citalopram (CELEXA) 10 MG tablet Take 1 tablet (10 mg total) by mouth daily.  . metroNIDAZOLE (FLAGYL) 500 MG tablet Take 1 tablet (500 mg total) by mouth 3 (three) times daily.  . [DISCONTINUED] azithromycin (ZITHROMAX Z-PAK) 250 MG tablet USE AS DIRECTED FOR 5 DAYS (Patient not taking: Reported on 01/08/2019)   No facility-administered encounter medications on file as of 01/08/2019.       Medical History: Past Medical History:  Diagnosis Date  .  Anxiety      Vital Signs: BP 116/78   Pulse 96   Temp 98.6 F (37 C)   Resp 16   Ht 5\' 9"  (1.753 m)   Wt 240 lb (108.9 kg)   SpO2 96%   BMI 35.44 kg/m    Review of Systems  Constitutional: Negative.  Negative for chills, fatigue and unexpected weight change.  HENT: Negative.  Negative for congestion, rhinorrhea, sneezing and sore throat.   Eyes: Negative for redness.  Respiratory: Negative.  Negative for cough, chest tightness and shortness of breath.   Cardiovascular: Negative.  Negative for chest pain and palpitations.  Gastrointestinal: Positive for diarrhea and nausea. Negative for abdominal pain, constipation and vomiting.  Endocrine: Negative.   Genitourinary: Negative.  Negative for dysuria and frequency.  Musculoskeletal: Negative.  Negative for arthralgias, back pain, joint swelling and neck pain.  Skin: Negative.  Negative for rash.  Allergic/Immunologic: Negative.   Neurological: Negative.  Negative for tremors and numbness.  Hematological: Negative for adenopathy. Does not bruise/bleed easily.  Psychiatric/Behavioral: Negative.  Negative for behavioral problems, sleep disturbance and suicidal ideas. The patient is not nervous/anxious.     Physical Exam Vitals signs and nursing note reviewed.  Constitutional:      General: He is not in acute distress.    Appearance: He is well-developed. He is not diaphoretic.  HENT:     Head: Normocephalic and atraumatic.     Mouth/Throat:  Pharynx: No oropharyngeal exudate.  Eyes:     Pupils: Pupils are equal, round, and reactive to light.  Neck:     Musculoskeletal: Normal range of motion and neck supple.     Thyroid: No thyromegaly.     Vascular: No JVD.     Trachea: No tracheal deviation.  Cardiovascular:     Rate and Rhythm: Normal rate and regular rhythm.     Heart sounds: Normal heart sounds. No murmur. No friction rub. No gallop.   Pulmonary:     Effort: Pulmonary effort is normal. No respiratory distress.      Breath sounds: Normal breath sounds. No wheezing or rales.  Chest:     Chest wall: No tenderness.  Abdominal:     Palpations: Abdomen is soft.     Tenderness: There is no abdominal tenderness. There is no guarding.  Musculoskeletal: Normal range of motion.  Lymphadenopathy:     Cervical: No cervical adenopathy.  Skin:    General: Skin is warm and dry.  Neurological:     Mental Status: He is alert and oriented to person, place, and time.     Cranial Nerves: No cranial nerve deficit.  Psychiatric:        Behavior: Behavior normal.        Thought Content: Thought content normal.        Judgment: Judgment normal.    Assessment/Plan: 1. Functional diarrhea Patient is given course of Flagyl.  Instructed patient on using patient until completed.  If he develops new symptoms he will come back to the clinic.  Patient also provided with lab slip to have physical labs drawn for his physical appointment that he will make it for the next couple weeks. - metroNIDAZOLE (FLAGYL) 500 MG tablet; Take 1 tablet (500 mg total) by mouth 3 (three) times daily.  Dispense: 30 tablet; Refill: 0  2. Anxiety Refill patient's Celexa at this visit. - citalopram (CELEXA) 10 MG tablet; Take 1 tablet (10 mg total) by mouth daily.  Dispense: 90 tablet; Refill: 0  3. Gastroesophageal reflux disease without esophagitis Stable, continue current medications as prescribed.  General Counseling: eiker saed understanding of the findings of todays visit and agrees with plan of treatment. I have discussed any further diagnostic evaluation that may be needed or ordered today. We also reviewed his medications today. he has been encouraged to call the office with any questions or concerns that should arise related to todays visit.   No orders of the defined types were placed in this encounter.   Meds ordered this encounter  Medications  . metroNIDAZOLE (FLAGYL) 500 MG tablet    Sig: Take 1 tablet (500 mg  total) by mouth 3 (three) times daily.    Dispense:  30 tablet    Refill:  0  . citalopram (CELEXA) 10 MG tablet    Sig: Take 1 tablet (10 mg total) by mouth daily.    Dispense:  90 tablet    Refill:  0    Pt need appt for next refills    Time spent: 25 Minutes  This patient was seen by Blima Ledger AGNP-C in Collaboration with Dr Lyndon Code as a part of collaborative care agreement.  Johnna Acosta AGNP-C Internal Medicine

## 2019-01-09 ENCOUNTER — Other Ambulatory Visit: Payer: Self-pay | Admitting: Adult Health

## 2019-01-10 LAB — COMPREHENSIVE METABOLIC PANEL
A/G RATIO: 1.9 (ref 1.2–2.2)
ALT: 17 IU/L (ref 0–44)
AST: 17 IU/L (ref 0–40)
Albumin: 4.6 g/dL (ref 4.1–5.2)
Alkaline Phosphatase: 73 IU/L (ref 39–117)
BUN / CREAT RATIO: 13 (ref 9–20)
BUN: 12 mg/dL (ref 6–20)
Bilirubin Total: 0.7 mg/dL (ref 0.0–1.2)
CO2: 21 mmol/L (ref 20–29)
Calcium: 9.6 mg/dL (ref 8.7–10.2)
Chloride: 104 mmol/L (ref 96–106)
Creatinine, Ser: 0.89 mg/dL (ref 0.76–1.27)
GFR calc Af Amer: 134 mL/min/{1.73_m2} (ref >59)
GFR calc non Af Amer: 116 mL/min/{1.73_m2} (ref >59)
Globulin, Total: 2.4 g/dL (ref 1.5–4.5)
Glucose: 95 mg/dL (ref 65–99)
POTASSIUM: 4.4 mmol/L (ref 3.5–5.2)
Sodium: 141 mmol/L (ref 134–144)
Total Protein: 7 g/dL (ref 6.0–8.5)

## 2019-01-10 LAB — CBC WITH DIFFERENTIAL/PLATELET
Basophils Absolute: 0.1 10*3/uL (ref 0.0–0.2)
Basos: 1 %
EOS (ABSOLUTE): 0.4 10*3/uL (ref 0.0–0.4)
Eos: 4 %
Hematocrit: 44.9 % (ref 37.5–51.0)
Hemoglobin: 15.9 g/dL (ref 13.0–17.7)
Immature Grans (Abs): 0 10*3/uL (ref 0.0–0.1)
Immature Granulocytes: 0 %
LYMPHS ABS: 2.5 10*3/uL (ref 0.7–3.1)
Lymphs: 28 %
MCH: 29.8 pg (ref 26.6–33.0)
MCHC: 35.4 g/dL (ref 31.5–35.7)
MCV: 84 fL (ref 79–97)
MONOS ABS: 0.8 10*3/uL (ref 0.1–0.9)
Monocytes: 9 %
NEUTROS ABS: 5.3 10*3/uL (ref 1.4–7.0)
Neutrophils: 58 %
Platelets: 250 10*3/uL (ref 150–450)
RBC: 5.33 x10E6/uL (ref 4.14–5.80)
RDW: 11.9 % (ref 11.6–15.4)
WBC: 9.1 10*3/uL (ref 3.4–10.8)

## 2019-01-10 LAB — FERRITIN: Ferritin: 252 ng/mL (ref 30–400)

## 2019-01-10 LAB — LIPID PANEL WITH LDL/HDL RATIO
Cholesterol, Total: 180 mg/dL (ref 100–199)
HDL: 28 mg/dL — AB (ref >39)
LDL Calculated: 117 mg/dL — ABNORMAL HIGH (ref 0–99)
LDl/HDL Ratio: 4.2 ratio — ABNORMAL HIGH (ref 0.0–3.6)
Triglycerides: 176 mg/dL — ABNORMAL HIGH (ref 0–149)
VLDL Cholesterol Cal: 35 mg/dL (ref 5–40)

## 2019-01-10 LAB — TSH: TSH: 1.74 u[IU]/mL (ref 0.450–4.500)

## 2019-01-10 LAB — IRON AND TIBC
Iron Saturation: 38 % (ref 15–55)
Iron: 109 ug/dL (ref 38–169)
Total Iron Binding Capacity: 285 ug/dL (ref 250–450)
UIBC: 176 ug/dL (ref 111–343)

## 2019-01-10 LAB — B12 AND FOLATE PANEL
Folate: 7.1 ng/mL (ref >3.0)
Vitamin B-12: 322 pg/mL (ref 232–1245)

## 2019-01-10 LAB — T4, FREE: FREE T4: 1.27 ng/dL (ref 0.82–1.77)

## 2019-01-10 LAB — HGB A1C W/O EAG: Hgb A1c MFr Bld: 5.3 % (ref 4.8–5.6)

## 2019-01-10 LAB — VITAMIN D 25 HYDROXY (VIT D DEFICIENCY, FRACTURES): Vit D, 25-Hydroxy: 17.9 ng/mL — ABNORMAL LOW (ref 30.0–100.0)

## 2019-01-13 ENCOUNTER — Telehealth: Payer: Self-pay

## 2019-01-13 NOTE — Telephone Encounter (Signed)
Pt called saying that he has severe nasal congestion and sore throat, no other symptoms.  Spoke with Madelaine Bhat and he recommended pt take otc medication such has dayquil, nyquil, cold & congestion medication consistently and diligently for 3 days. If he does not get better than to give [place] a call back.   Spoke with patient and notified pt of the above.

## 2019-01-14 ENCOUNTER — Ambulatory Visit: Payer: Managed Care, Other (non HMO) | Admitting: Adult Health

## 2019-01-14 ENCOUNTER — Telehealth: Payer: Self-pay

## 2019-01-14 ENCOUNTER — Encounter: Payer: Self-pay | Admitting: Adult Health

## 2019-01-14 ENCOUNTER — Telehealth: Payer: Self-pay | Admitting: Adult Health

## 2019-01-14 VITALS — BP 126/78 | HR 99 | Temp 98.3°F | Resp 16 | Ht 69.0 in | Wt 230.0 lb

## 2019-01-14 DIAGNOSIS — J029 Acute pharyngitis, unspecified: Secondary | ICD-10-CM

## 2019-01-14 DIAGNOSIS — J020 Streptococcal pharyngitis: Secondary | ICD-10-CM

## 2019-01-14 DIAGNOSIS — E559 Vitamin D deficiency, unspecified: Secondary | ICD-10-CM

## 2019-01-14 LAB — POCT RAPID STREP A (OFFICE): Rapid Strep A Screen: POSITIVE — AB

## 2019-01-14 MED ORDER — AZITHROMYCIN 250 MG PO TABS: ORAL_TABLET | ORAL | 0 refills | Status: DC

## 2019-01-14 MED ORDER — VITAMIN D (ERGOCALCIFEROL) 1.25 MG (50000 UNIT) PO CAPS: 50000.0000 [IU] | ORAL_CAPSULE | ORAL | 1 refills | Status: DC

## 2019-01-14 NOTE — Telephone Encounter (Signed)
Pt advised that chol is little high cut fatty,fried and greasy foods and also vitamin d is low we send drisdol to phar and also take OTC multivitamin with vitamin d

## 2019-01-14 NOTE — Telephone Encounter (Signed)
-----   Message from Johnna Acosta, NP sent at 01/14/2019  2:01 PM EDT ----- His Cholesterol is a little high.  Greasy, fatty, fried foods are the culprit.  He should try to lay off of that.  We will recheck it in 6-12 months.  Also his vitamin D level is low.  Will send him some vit D pills to take once a week for 8 weeks.  Then he should get a multivitamin or some vit D OTC meds to take daily.

## 2019-01-14 NOTE — Progress Notes (Signed)
[facility] [address] [place], [place] [address]  Internal MEDICINE  Office Visit Note  Patient Name: Alfred Gross  [id number]  [id number]  Date of Service: 01/14/2019  Chief Complaint  Patient presents with  . Sore Throat    otc medication , needs note to return to work.  . Sinusitis     HPI Pt is here for a sick visit. Pt reports 2 days ago he noticed his throat began to hurt.  He has pain with swallowing.  Denies fever, but reports neck pain. He has been taking otc meds for pain with good relief. However, he feels like he is not getting better so he came in to be checked out today.    Current Medication:  Outpatient Encounter Medications as of 01/14/2019  Medication Sig  . albuterol (PROVENTIL HFA;VENTOLIN HFA) 108 (90 Base) MCG/ACT inhaler Inhale 2 puffs into the lungs every 6 (six) hours as needed for wheezing or shortness of breath.  . cetirizine (ZYRTEC) 10 MG tablet Take 10 mg by mouth daily.  . citalopram (CELEXA) 10 MG tablet Take 1 tablet (10 mg total) by mouth daily.  . clotrimazole-betamethasone (LOTRISONE) cream Apply 1 application topically 2 (two) times daily. Apply extrernaly bid to feet prn  . fluticasone (FLONASE) 50 MCG/ACT nasal spray Place 1 spray into both nostrils daily.  . metroNIDAZOLE (FLAGYL) 500 MG tablet Take 1 tablet (500 mg total) by mouth 3 (three) times daily.  Marland Kitchen omeprazole (PRILOSEC) 40 MG capsule Take 1 capsule (40 mg total) by mouth daily.  Marland Kitchen azithromycin (ZITHROMAX) 250 MG tablet Take as directed   No facility-administered encounter medications on file as of 01/14/2019.       Medical History: Past Medical History:  Diagnosis Date  . Anxiety      Vital Signs: BP 126/78   Pulse 99   Temp 98.3 F (36.8 C)   Resp 16   Ht 5\' 9"  (1.753 m)   Wt 230 lb (104.3 kg)   SpO2 95%   BMI 33.97 kg/m    Review of Systems  Constitutional: Negative.  Negative for chills, fatigue and unexpected weight change.  HENT:  Positive for sinus pressure, sore throat and trouble swallowing. Negative for congestion, rhinorrhea and sneezing.   Eyes: Negative for redness.  Respiratory: Negative.  Negative for cough, chest tightness and shortness of breath.   Cardiovascular: Negative.  Negative for chest pain and palpitations.  Gastrointestinal: Negative.  Negative for abdominal pain, constipation, diarrhea, nausea and vomiting.  Endocrine: Negative.   Genitourinary: Negative.  Negative for dysuria and frequency.  Musculoskeletal: Negative.  Negative for arthralgias, back pain, joint swelling and neck pain.  Skin: Negative.  Negative for rash.  Allergic/Immunologic: Negative.   Neurological: Negative.  Negative for tremors and numbness.  Hematological: Negative for adenopathy. Does not bruise/bleed easily.  Psychiatric/Behavioral: Negative.  Negative for behavioral problems, sleep disturbance and suicidal ideas. The patient is not nervous/anxious.     Physical Exam Vitals signs and nursing note reviewed.  Constitutional:      General: He is not in acute distress.    Appearance: He is well-developed. He is not diaphoretic.  HENT:     Head: Normocephalic and atraumatic.     Mouth/Throat:     Mouth: Mucous membranes are moist.     Pharynx: Uvula midline. Pharyngeal swelling, posterior oropharyngeal erythema and uvula swelling present. No oropharyngeal exudate.     Tonsils: Tonsillar exudate present. Swelling: 2+ on the right. 2+ on the left.  Eyes:     Pupils: Pupils are equal, round, and reactive to light.  Neck:     Musculoskeletal: Normal range of motion and neck supple.     Thyroid: No thyromegaly.     Vascular: No JVD.     Trachea: No tracheal deviation.  Cardiovascular:     Rate and Rhythm: Normal rate and regular rhythm.     Heart sounds: Normal heart sounds. No murmur. No friction rub. No gallop.   Pulmonary:     Effort: Pulmonary effort is normal. No respiratory distress.     Breath sounds: Normal  breath sounds. No wheezing or rales.  Chest:     Chest wall: No tenderness.  Abdominal:     Palpations: Abdomen is soft.     Tenderness: There is no abdominal tenderness. There is no guarding.  Musculoskeletal: Normal range of motion.  Lymphadenopathy:     Cervical: No cervical adenopathy.  Skin:    General: Skin is warm and dry.  Neurological:     Mental Status: He is alert and oriented to person, place, and time.     Cranial Nerves: No cranial nerve deficit.  Psychiatric:        Behavior: Behavior normal.        Thought Content: Thought content normal.        Judgment: Judgment normal.    Assessment/Plan: 1. Strep pharyngitis Pt is PCN allergic.  Will treat with course of Zithromax. Advised patient to take entire course of antibiotics as prescribed with food. Pt should return to clinic in 7-10 days if symptoms fail to improve or new symptoms develop.  - azithromycin (ZITHROMAX) 250 MG tablet; Take as directed  Dispense: 6 tablet; Refill: 0  2. Sore throat Positive for Strep. PT will return to clinic if symptoms fail to improve.  - POCT rapid strep A 3. Vitamin D deficiency Instructed patient to take Vit D tabs weekly for 8-10 weeks.  Then switch to a daily multi-vitamin or a Vit D supplement.  Will recheck his level in a few months.  - Vitamin D, Ergocalciferol, (DRISDOL) 1.25 MG (50000 UT) CAPS capsule; Take 1 capsule (50,000 Units total) by mouth every 7 (seven) days.  Dispense: 5 capsule; Refill: 1  General Counseling: Alfred Gross verbalizes understanding of the findings of todays visit and agrees with plan of treatment. I have discussed any further diagnostic evaluation that may be needed or ordered today. We also reviewed his medications today. he has been encouraged to call the office with any questions or concerns that should arise related to todays visit.   Orders Placed This Encounter  Procedures  . POCT rapid strep A    Meds ordered this encounter  Medications  .  azithromycin (ZITHROMAX) 250 MG tablet    Sig: Take as directed    Dispense:  6 tablet    Refill:  0    Time spent: 25 Minutes  This patient was seen by Blima Ledger AGNP-C in Collaboration with Dr Lyndon Code as a part of collaborative care agreement.  Johnna Acosta AGNP-C Internal Medicine

## 2019-01-14 NOTE — Telephone Encounter (Signed)
Patient advised on results of bloodwork and rx at pharmacy

## 2019-01-14 NOTE — Telephone Encounter (Signed)
Called [facility] for pt results from testing done 01/09/2019, spoke with Reita Cliche and [employer], [place] resent it through Epic I still didn't see any results, called back and spoke with Crystal and Crystal will be faxing the results to the office.

## 2019-01-28 ENCOUNTER — Encounter: Payer: 59 | Admitting: Nurse Practitioner

## 2019-02-17 ENCOUNTER — Ambulatory Visit: Payer: 59 | Admitting: Nurse Practitioner

## 2019-02-17 ENCOUNTER — Encounter: Payer: Self-pay | Admitting: Nurse Practitioner

## 2019-02-17 ENCOUNTER — Other Ambulatory Visit: Payer: Self-pay

## 2019-02-17 DIAGNOSIS — E559 Vitamin D deficiency, unspecified: Secondary | ICD-10-CM

## 2019-02-17 DIAGNOSIS — F419 Anxiety disorder, unspecified: Secondary | ICD-10-CM | POA: Diagnosis not present

## 2019-02-17 DIAGNOSIS — K219 Gastro-esophageal reflux disease without esophagitis: Secondary | ICD-10-CM

## 2019-02-17 MED ORDER — VITAMIN D (ERGOCALCIFEROL) 1.25 MG (50000 UNIT) PO CAPS: 50000.0000 [IU] | ORAL_CAPSULE | ORAL | 5 refills | Status: DC

## 2019-02-17 MED ORDER — CITALOPRAM HYDROBROMIDE 10 MG PO TABS: 10.0000 mg | ORAL_TABLET | Freq: Every day | ORAL | 3 refills | Status: DC

## 2019-02-17 MED ORDER — OMEPRAZOLE 40 MG PO CPDR: 40.0000 mg | DELAYED_RELEASE_CAPSULE | Freq: Every day | ORAL | 3 refills | Status: DC

## 2019-02-17 NOTE — Progress Notes (Signed)
[facility] [address] [place], [place] [id number]  Internal MEDICINE  Telephone Visit  Patient Name: Alfred Gross  [birth year]  [id number]  Date of Service: 03/01/2019  I connected with the patient at 4:31pm by webcam and verified the patients identity using two identifiers.   I discussed the limitations, risks, security and privacy concerns of performing an evaluation and management service by webcam and the availability of in person appointments. I also discussed with the patient that there may be a patient responsible charge related to the service.  The patient expressed understanding and agrees to proceed.    Chief Complaint  Patient presents with  . Telephone Screen  . Medical Management of Chronic Issues    3 week follow up labs   . Telephone Assessment    The patient has been contacted via webcam for follow up visit due to concerns for spread of novel coronavirus. The patient states that he is currently doing well. Allergies are well controlled and he has not had acute flare up or sinus infection in a little while. Citalopram is controlling his anxiety and depression. Acid reflux is improved.       Current Medication: Outpatient Encounter Medications as of 02/17/2019  Medication Sig  . albuterol (PROVENTIL HFA;VENTOLIN HFA) 108 (90 Base) MCG/ACT inhaler Inhale 2 puffs into the lungs every 6 (six) hours as needed for wheezing or shortness of breath.  . cetirizine (ZYRTEC) 10 MG tablet Take 10 mg by mouth daily.  . citalopram (CELEXA) 10 MG tablet Take 1 tablet (10 mg total) by mouth daily.  . clotrimazole-betamethasone (LOTRISONE) cream Apply 1 application topically 2 (two) times daily. Apply extrernaly bid to feet prn  . fluticasone (FLONASE) 50 MCG/ACT nasal spray Place 1 spray into both nostrils daily.  . metroNIDAZOLE (FLAGYL) 500 MG tablet Take 1 tablet (500 mg total) by mouth 3 (three) times daily.  Marland Kitchen. omeprazole (PRILOSEC) 40 MG capsule Take 1  capsule (40 mg total) by mouth daily.  . Vitamin D, Ergocalciferol, (DRISDOL) 1.25 MG (50000 UT) CAPS capsule Take 1 capsule (50,000 Units total) by mouth every 7 (seven) days.  . [DISCONTINUED] citalopram (CELEXA) 10 MG tablet Take 1 tablet (10 mg total) by mouth daily.  . [DISCONTINUED] omeprazole (PRILOSEC) 40 MG capsule Take 1 capsule (40 mg total) by mouth daily.  . [DISCONTINUED] Vitamin D, Ergocalciferol, (DRISDOL) 1.25 MG (50000 UT) CAPS capsule Take 1 capsule (50,000 Units total) by mouth every 7 (seven) days.  . [DISCONTINUED] azithromycin (ZITHROMAX) 250 MG tablet Take as directed (Patient not taking: Reported on 02/17/2019)   No facility-administered encounter medications on file as of 02/17/2019.     Surgical History: History reviewed. No pertinent surgical history.  Medical History: Past Medical History:  Diagnosis Date  . Anxiety     Family History: Family History  Problem Relation Age of Onset  . Healthy Mother   . Healthy Father     Social History   Socioeconomic History  . Marital status: Married    Spouse name: Not on file  . Number of children: Not on file  . Years of education: Not on file  . Highest education level: Not on file  Occupational History  . Not on file  Social Needs  . Financial resource strain: Not on file  . Food insecurity:    Worry: Not on file    Inability: Not on file  . Transportation needs:    Medical: Not on file    Non-medical: Not on  file  Tobacco Use  . Smoking status: Former Smoker    Packs/day: 1.00    Years: 10.00    Pack years: 10.00    Types: Cigarettes    Last attempt to quit: 11/28/2016    Years since quitting: 2.2  . Smokeless tobacco: Never Used  Substance and Sexual Activity  . Alcohol use: No  . Drug use: No  . Sexual activity: Not on file  Lifestyle  . Physical activity:    Days per week: Not on file    Minutes per session: Not on file  . Stress: Not on file  Relationships  . Social connections:     Talks on phone: Not on file    Gets together: Not on file    Attends religious service: Not on file    Active member of club or organization: Not on file    Attends meetings of clubs or organizations: Not on file    Relationship status: Not on file  . Intimate partner violence:    Fear of current or ex partner: Not on file    Emotionally abused: Not on file    Physically abused: Not on file    Forced sexual activity: Not on file  Other Topics Concern  . Not on file  Social History Narrative  . Not on file      Review of Systems  Constitutional: Negative for activity change, chills, fatigue and unexpected weight change.  HENT: Negative for congestion, rhinorrhea, sinus pressure, sneezing, sore throat and trouble swallowing.   Respiratory: Negative for cough, chest tightness, shortness of breath and wheezing.   Cardiovascular: Negative for chest pain and palpitations.  Gastrointestinal: Negative for abdominal pain, constipation, diarrhea, nausea and vomiting.  Musculoskeletal: Negative for arthralgias, back pain, joint swelling and neck pain.  Skin: Negative for rash.  Allergic/Immunologic: Positive for environmental allergies.  Neurological: Negative for dizziness, tremors, numbness and headaches.  Hematological: Negative for adenopathy. Does not bruise/bleed easily.  Psychiatric/Behavioral: Negative for behavioral problems, sleep disturbance and suicidal ideas. The patient is nervous/anxious.     Today's Vitals   02/17/19 1447  Weight: 230 lb (104.3 kg)  Height: 5\' 9"  (1.753 m)   Body mass index is 33.97 kg/m.   Observation/Objective:  The patient is alert and oriented. He is pleasant and answers all questions appropriately. There is no labored breathing and the patient is in no acute distress at this time.    Assessment/Plan: 1. Gastroesophageal reflux disease without esophagitis Doing well. Continue omeprazole 40mg  daily. Refills provided today - omeprazole  (PRILOSEC) 40 MG capsule; Take 1 capsule (40 mg total) by mouth daily.  Dispense: 90 capsule; Refill: 3  2. Vitamin D deficiency - Vitamin D, Ergocalciferol, (DRISDOL) 1.25 MG (50000 UT) CAPS capsule; Take 1 capsule (50,000 Units total) by mouth every 7 (seven) days.  Dispense: 5 capsule; Refill: 5  3. Anxiety Well managed. Continue celexa 10mg  daily. Refills provided today.  - citalopram (CELEXA) 10 MG tablet; Take 1 tablet (10 mg total) by mouth daily.  Dispense: 90 tablet; Refill: 3  General Counseling: Alfred Gross verbalizes understanding of the findings of today's phone visit and agrees with plan of treatment. I have discussed any further diagnostic evaluation that may be needed or ordered today. We also reviewed his medications today. he has been encouraged to call the office with any questions or concerns that should arise related to todays visit.   This patient was seen by Vincent Gros FNP Collaboration with Dr Lyndon Code  as a part of collaborative care agreement  Meds ordered this encounter  Medications  . citalopram (CELEXA) 10 MG tablet    Sig: Take 1 tablet (10 mg total) by mouth daily.    Dispense:  90 tablet    Refill:  3    Pt need appt for next refills    Order Specific Question:   Supervising Provider    Answer:   Lyndon Code [[id number]]  . omeprazole (PRILOSEC) 40 MG capsule    Sig: Take 1 capsule (40 mg total) by mouth daily.    Dispense:  90 capsule    Refill:  3    Order Specific Question:   Supervising Provider    Answer:   Lyndon Code [[id number]]  . Vitamin D, Ergocalciferol, (DRISDOL) 1.25 MG (50000 UT) CAPS capsule    Sig: Take 1 capsule (50,000 Units total) by mouth every 7 (seven) days.    Dispense:  5 capsule    Refill:  5    Order Specific Question:   Supervising Provider    Answer:   Lyndon Code [[id number]]    Time spent: 35 Minutes    Dr Lyndon Code Internal medicine

## 2019-03-01 DIAGNOSIS — E559 Vitamin D deficiency, unspecified: Secondary | ICD-10-CM | POA: Insufficient documentation

## 2019-05-11 ENCOUNTER — Other Ambulatory Visit: Payer: Self-pay

## 2019-05-11 ENCOUNTER — Ambulatory Visit: Payer: 59 | Admitting: Adult Health

## 2019-05-11 ENCOUNTER — Encounter: Payer: Self-pay | Admitting: Adult Health

## 2019-05-11 VITALS — Ht 69.0 in | Wt 235.0 lb

## 2019-05-11 DIAGNOSIS — R197 Diarrhea, unspecified: Secondary | ICD-10-CM

## 2019-05-11 NOTE — Progress Notes (Signed)
[facility] [address] [place], [place] [address]  Internal MEDICINE  Telephone Visit  Patient Name: Alfred CenterChristopher Bunch  [birth year]  [id number]  Date of Service: 05/11/2019  I connected with the patient at 1246 by telephone and verified the patients identity using two identifiers.   I discussed the limitations, risks, security and privacy concerns of performing an evaluation and management service by telephone and the availability of in person appointments. I also discussed with the patient that there may be a patient responsible charge related to the service.  The patient expressed understanding and agrees to proceed.    Chief Complaint  Patient presents with  . Telephone Assessment  . Telephone Screen  . Diarrhea    just last night he is feeling better he need wotk note     HPI  Pt reports he ate a BBQ chicken pizza yesterday.  He reports he had diarrhea 6-7 times.  He reports he drank water and slept last night.  He feels much better now.  His work will not let him return without a doctors note.  He has had not diarrhea today.  Denies nausea or vomiting.    Current Medication: Outpatient Encounter Medications as of 05/11/2019  Medication Sig  . albuterol (PROVENTIL HFA;VENTOLIN HFA) 108 (90 Base) MCG/ACT inhaler Inhale 2 puffs into the lungs every 6 (six) hours as needed for wheezing or shortness of breath.  . cetirizine (ZYRTEC) 10 MG tablet Take 10 mg by mouth daily.  . citalopram (CELEXA) 10 MG tablet Take 1 tablet (10 mg total) by mouth daily.  . clotrimazole-betamethasone (LOTRISONE) cream Apply 1 application topically 2 (two) times daily. Apply extrernaly bid to feet prn  . fluticasone (FLONASE) 50 MCG/ACT nasal spray Place 1 spray into both nostrils daily.  Marland Kitchen. omeprazole (PRILOSEC) 40 MG capsule Take 1 capsule (40 mg total) by mouth daily.  . Vitamin D, Ergocalciferol, (DRISDOL) 1.25 MG (50000 UT) CAPS capsule Take 1 capsule (50,000 Units total) by mouth  every 7 (seven) days.  . metroNIDAZOLE (FLAGYL) 500 MG tablet Take 1 tablet (500 mg total) by mouth 3 (three) times daily. (Patient not taking: Reported on 05/11/2019)   No facility-administered encounter medications on file as of 05/11/2019.     Surgical History: History reviewed. No pertinent surgical history.  Medical History: Past Medical History:  Diagnosis Date  . Anxiety     Family History: Family History  Problem Relation Age of Onset  . Healthy Mother   . Healthy Father     Social History   Socioeconomic History  . Marital status: Married    Spouse name: Not on file  . Number of children: Not on file  . Years of education: Not on file  . Highest education level: Not on file  Occupational History  . Not on file  Social Needs  . Financial resource strain: Not on file  . Food insecurity    Worry: Not on file    Inability: Not on file  . Transportation needs    Medical: Not on file    Non-medical: Not on file  Tobacco Use  . Smoking status: Former Smoker    Packs/day: 1.00    Years: 10.00    Pack years: 10.00    Types: Cigarettes    Quit date: 11/28/2016    Years since quitting: 2.4  . Smokeless tobacco: Never Used  Substance and Sexual Activity  . Alcohol use: No  . Drug use: No  . Sexual activity: Not  on file  Lifestyle  . Physical activity    Days per week: Not on file    Minutes per session: Not on file  . Stress: Not on file  Relationships  . Social Herbalist on phone: Not on file    Gets together: Not on file    Attends religious service: Not on file    Active member of club or organization: Not on file    Attends meetings of clubs or organizations: Not on file    Relationship status: Not on file  . Intimate partner violence    Fear of current or ex partner: Not on file    Emotionally abused: Not on file    Physically abused: Not on file    Forced sexual activity: Not on file  Other Topics Concern  . Not on file  Social  History Narrative  . Not on file      Review of Systems  Constitutional: Negative.  Negative for chills, fatigue and unexpected weight change.  HENT: Negative.  Negative for congestion, rhinorrhea, sneezing and sore throat.   Eyes: Negative for redness.  Respiratory: Negative.  Negative for cough, chest tightness and shortness of breath.   Cardiovascular: Negative.  Negative for chest pain and palpitations.  Gastrointestinal: Negative.  Negative for abdominal pain, constipation, diarrhea, nausea and vomiting.  Endocrine: Negative.   Genitourinary: Negative.  Negative for dysuria and frequency.  Musculoskeletal: Negative.  Negative for arthralgias, back pain, joint swelling and neck pain.  Skin: Negative.  Negative for rash.  Allergic/Immunologic: Negative.   Neurological: Negative.  Negative for tremors and numbness.  Hematological: Negative for adenopathy. Does not bruise/bleed easily.  Psychiatric/Behavioral: Negative.  Negative for behavioral problems, sleep disturbance and suicidal ideas. The patient is not nervous/anxious.     Vital Signs: Ht 5\' 9"  (1.753 m)   Wt 235 lb (106.6 kg)   BMI 34.70 kg/m    Observation/Objective: Well appearing, nad noted.    Assessment/Plan: 1. Diarrhea, unspecified type Resolved on its on. Needs doctors note to return to work.  Work note to return tomorrow.     General Counseling: caelen reierson understanding of the findings of today's phone visit and agrees with plan of treatment. I have discussed any further diagnostic evaluation that may be needed or ordered today. We also reviewed his medications today. he has been encouraged to call the office with any questions or concerns that should arise related to todays visit.    No orders of the defined types were placed in this encounter.   No orders of the defined types were placed in this encounter.   Time spent: [place] AGNP-C Internal medicine

## 2019-05-15 ENCOUNTER — Encounter: Payer: Self-pay | Admitting: Nurse Practitioner

## 2019-05-15 ENCOUNTER — Ambulatory Visit: Payer: 59 | Admitting: Adult Health

## 2019-05-15 ENCOUNTER — Other Ambulatory Visit: Payer: Self-pay

## 2019-05-15 VITALS — Temp 97.9°F | Ht 69.0 in | Wt 233.0 lb

## 2019-05-15 DIAGNOSIS — H6692 Otitis media, unspecified, left ear: Secondary | ICD-10-CM | POA: Diagnosis not present

## 2019-05-15 DIAGNOSIS — T7840XD Allergy, unspecified, subsequent encounter: Secondary | ICD-10-CM

## 2019-05-15 MED ORDER — FLUTICASONE PROPIONATE 50 MCG/ACT NA SUSP: 1.0000 | Freq: Every day | NASAL | 3 refills | Status: DC

## 2019-05-15 MED ORDER — AZITHROMYCIN 250 MG PO TABS: ORAL_TABLET | ORAL | 0 refills | Status: DC

## 2019-05-15 MED ORDER — FLUTICASONE PROPIONATE 50 MCG/ACT NA SUSP: 2.0000 | Freq: Two times a day (BID) | NASAL | 6 refills | Status: DC

## 2019-05-15 NOTE — Progress Notes (Signed)
[facility] [address] [place], [place] [address]  Internal MEDICINE  Telephone Visit  Patient Name: Alfred Gross  1992  [id number]  Date of Service: 05/15/2019  I connected with the patient at 200 by telephone and verified the patients identity using two identifiers.   I discussed the limitations, risks, security and privacy concerns of performing an evaluation and management service by telephone and the availability of in person appointments. I also discussed with the patient that there may be a patient responsible charge related to the service.  The patient expressed understanding and agrees to proceed.    Chief Complaint  Patient presents with  . Telephone Assessment  . Telephone Screen  . Ear Pain    left ear    HPI  PT seen via video for ear discomfort.  PT reports he was out in the yard working, and his allergy symptoms flared up.  He restarted his zyrtec, but is out of Flonase and his left ear has fluid in it and has begun to bother him. This started about 5 days ago, and seems to be lingering, and possibly getting worse. He has a history of frequent ear infections.   Current Medication: Outpatient Encounter Medications as of 05/15/2019  Medication Sig  . albuterol (PROVENTIL HFA;VENTOLIN HFA) 108 (90 Base) MCG/ACT inhaler Inhale 2 puffs into the lungs every 6 (six) hours as needed for wheezing or shortness of breath.  . cetirizine (ZYRTEC) 10 MG tablet Take 10 mg by mouth daily.  . citalopram (CELEXA) 10 MG tablet Take 1 tablet (10 mg total) by mouth daily.  . clotrimazole-betamethasone (LOTRISONE) cream Apply 1 application topically 2 (two) times daily. Apply extrernaly bid to feet prn  . metroNIDAZOLE (FLAGYL) 500 MG tablet Take 1 tablet (500 mg total) by mouth 3 (three) times daily.  Marland Kitchen. omeprazole (PRILOSEC) 40 MG capsule Take 1 capsule (40 mg total) by mouth daily.  . Vitamin D, Ergocalciferol, (DRISDOL) 1.25 MG (50000 UT) CAPS capsule Take 1  capsule (50,000 Units total) by mouth every 7 (seven) days.  . [DISCONTINUED] fluticasone (FLONASE) 50 MCG/ACT nasal spray Place 1 spray into both nostrils daily.  . [DISCONTINUED] fluticasone (FLONASE) 50 MCG/ACT nasal spray Place 1 spray into both nostrils daily.  Marland Kitchen. azithromycin (ZITHROMAX) 250 MG tablet Take as directed  . fluticasone (FLONASE) 50 MCG/ACT nasal spray Place 2 sprays into both nostrils 2 (two) times a day.   No facility-administered encounter medications on file as of 05/15/2019.     Surgical History: History reviewed. No pertinent surgical history.  Medical History: Past Medical History:  Diagnosis Date  . Anxiety     Family History: Family History  Problem Relation Age of Onset  . Healthy Mother   . Healthy Father     Social History   Socioeconomic History  . Marital status: Married    Spouse name: Not on file  . Number of children: Not on file  . Years of education: Not on file  . Highest education level: Not on file  Occupational History  . Not on file  Social Needs  . Financial resource strain: Not on file  . Food insecurity    Worry: Not on file    Inability: Not on file  . Transportation needs    Medical: Not on file    Non-medical: Not on file  Tobacco Use  . Smoking status: Former Smoker    Packs/day: 1.00    Years: 10.00    Pack years: 10.00  Types: Cigarettes    Quit date: 11/28/2016    Years since quitting: 2.4  . Smokeless tobacco: Never Used  Substance and Sexual Activity  . Alcohol use: No  . Drug use: No  . Sexual activity: Not on file  Lifestyle  . Physical activity    Days per week: Not on file    Minutes per session: Not on file  . Stress: Not on file  Relationships  . Social Musicianconnections    Talks on phone: Not on file    Gets together: Not on file    Attends religious service: Not on file    Active member of club or organization: Not on file    Attends meetings of clubs or organizations: Not on file     Relationship status: Not on file  . Intimate partner violence    Fear of current or ex partner: Not on file    Emotionally abused: Not on file    Physically abused: Not on file    Forced sexual activity: Not on file  Other Topics Concern  . Not on file  Social History Narrative  . Not on file      Review of Systems  Constitutional: Negative.  Negative for chills, fatigue and unexpected weight change.  HENT: Positive for ear pain, hearing loss, postnasal drip and sinus pressure. Negative for congestion, rhinorrhea, sneezing and sore throat.   Eyes: Negative for redness.  Respiratory: Negative.  Negative for cough, chest tightness and shortness of breath.   Cardiovascular: Negative.  Negative for chest pain and palpitations.  Gastrointestinal: Negative.  Negative for abdominal pain, constipation, diarrhea, nausea and vomiting.  Endocrine: Negative.   Genitourinary: Negative.  Negative for dysuria and frequency.  Musculoskeletal: Negative.  Negative for arthralgias, back pain, joint swelling and neck pain.  Skin: Negative.  Negative for rash.  Allergic/Immunologic: Negative.   Neurological: Negative.  Negative for tremors and numbness.  Hematological: Negative for adenopathy. Does not bruise/bleed easily.  Psychiatric/Behavioral: Negative.  Negative for behavioral problems, sleep disturbance and suicidal ideas. The patient is not nervous/anxious.     Vital Signs: Temp 97.9 F (36.6 C)   Ht 5\' 9"  (1.753 m)   Wt 233 lb (105.7 kg)   BMI 34.41 kg/m    Observation/Objective:  Well appearing, speaking in full sentences.  NAD noted.   Assessment/Plan: 1. Allergic state, subsequent encounter Instructed patient to continue zyrtec, and use Flonase as prescribed.  He may also use some sudafed or similar decongestant to alleviate sinus pressure.  - fluticasone (FLONASE) 50 MCG/ACT nasal spray; Place 2 spray into both nostrils twice daily.  Dispense: 16 g; Refill: 3  2. Otitis of  left ear Advised patient to take entire course of antibiotics as prescribed with food. Pt should return to clinic in 7-10 days if symptoms fail to improve or new symptoms develop.  - azithromycin (ZITHROMAX) 250 MG tablet; Take as directed  Dispense: 6 tablet; Refill: 0  General Counseling: Loden verbalizes understanding of the findings of today's phone visit and agrees with plan of treatment. I have discussed any further diagnostic evaluation that may be needed or ordered today. We also reviewed his medications today. he has been encouraged to call the office with any questions or concerns that should arise related to todays visit.    No orders of the defined types were placed in this encounter.   Meds ordered this encounter  Medications  . DISCONTD: fluticasone (FLONASE) 50 MCG/ACT nasal spray    Sig:  Place 1 spray into both nostrils daily.    Dispense:  16 g    Refill:  3  . azithromycin (ZITHROMAX) 250 MG tablet    Sig: Take as directed    Dispense:  6 tablet    Refill:  0  . fluticasone (FLONASE) 50 MCG/ACT nasal spray    Sig: Place 2 sprays into both nostrils 2 (two) times a day.    Dispense:  16 g    Refill:  6    Time spent: [place] AGNP-C Internal medicine

## 2019-05-21 ENCOUNTER — Telehealth: Payer: Self-pay | Admitting: Adult Health

## 2019-05-21 NOTE — Telephone Encounter (Signed)
[facility] called in regards to lynch syndrome test, there are numerous test , one that looks into a specific gene if known or test for 5 genes and two others that test could need prior authorization, of so [facility] will reach  Out to patient and let them know of any out of pocket owe money.  high risk colorectal

## 2019-05-27 ENCOUNTER — Other Ambulatory Visit: Payer: Self-pay

## 2019-05-27 DIAGNOSIS — K219 Gastro-esophageal reflux disease without esophagitis: Secondary | ICD-10-CM

## 2019-05-27 MED ORDER — OMEPRAZOLE 40 MG PO CPDR: 40.0000 mg | DELAYED_RELEASE_CAPSULE | Freq: Every day | ORAL | 1 refills | Status: DC

## 2019-05-28 ENCOUNTER — Other Ambulatory Visit: Payer: Self-pay

## 2019-05-28 DIAGNOSIS — K219 Gastro-esophageal reflux disease without esophagitis: Secondary | ICD-10-CM

## 2019-05-28 MED ORDER — OMEPRAZOLE 40 MG PO CPDR: 40.0000 mg | DELAYED_RELEASE_CAPSULE | Freq: Every day | ORAL | 1 refills | Status: DC

## 2019-05-29 ENCOUNTER — Ambulatory Visit: Payer: 59 | Admitting: Nurse Practitioner

## 2019-05-29 ENCOUNTER — Encounter: Payer: Self-pay | Admitting: Nurse Practitioner

## 2019-05-29 ENCOUNTER — Other Ambulatory Visit: Payer: Self-pay

## 2019-05-29 VITALS — Ht 69.0 in

## 2019-05-29 DIAGNOSIS — L247 Irritant contact dermatitis due to plants, except food: Secondary | ICD-10-CM | POA: Diagnosis not present

## 2019-05-29 MED ORDER — TRIAMCINOLONE ACETONIDE 0.025 % EX CREA: 1.0000 "application " | TOPICAL_CREAM | Freq: Two times a day (BID) | CUTANEOUS | 2 refills | Status: DC

## 2019-05-29 MED ORDER — PREDNISONE 10 MG (21) PO TBPK: ORAL_TABLET | ORAL | 0 refills | Status: DC

## 2019-05-29 NOTE — Progress Notes (Signed)
[facility] [address] [place], [place] [address]  Internal MEDICINE  Telephone Visit  Patient Name: Alfred Gross  1990  [id number]  Date of Service: 05/29/2019  I connected with the patient at 10:40am by webcam and verified the patients identity using two identifiers.   I discussed the limitations, risks, security and privacy concerns of performing an evaluation and management service by webcam and the availability of in person appointments. I also discussed with the patient that there may be a patient responsible charge related to the service.  The patient expressed understanding and agrees to proceed.    Chief Complaint  Patient presents with  . Telephone Screen  . Telephone Assessment  . Poison Oak    The patient has been contacted via webcam for follow up visit due to concerns for spread of novel coronavirus. The patient states that he has been doing a lot of work outdoors on new property. He has developed red, inflamed lesions on both lower legs, stretching from the feet to just below his knees. Gradually creeping up the leg. Very itchy and irritating. States that he has never been allergic to poison ivy or poison oak in the past.       Current Medication: Outpatient Encounter Medications as of 05/29/2019  Medication Sig  . albuterol (PROVENTIL HFA;VENTOLIN HFA) 108 (90 Base) MCG/ACT inhaler Inhale 2 puffs into the lungs every 6 (six) hours as needed for wheezing or shortness of breath.  . cetirizine (ZYRTEC) 10 MG tablet Take 10 mg by mouth daily.  . citalopram (CELEXA) 10 MG tablet Take 1 tablet (10 mg total) by mouth daily.  . clotrimazole-betamethasone (LOTRISONE) cream Apply 1 application topically 2 (two) times daily. Apply extrernaly bid to feet prn  . fluticasone (FLONASE) 50 MCG/ACT nasal spray Place 2 sprays into both nostrils 2 (two) times a day.  . metroNIDAZOLE (FLAGYL) 500 MG tablet Take 1 tablet (500 mg total) by mouth 3 (three) times  daily.  Marland Kitchen. omeprazole (PRILOSEC) 40 MG capsule Take 1 capsule (40 mg total) by mouth daily.  . predniSONE (STERAPRED UNI-PAK 21 TAB) 10 MG (21) TBPK tablet 6 day taper - take by mouth as directed for 6 days  . triamcinolone (KENALOG) 0.025 % cream Apply 1 application topically 2 (two) times daily.  . Vitamin D, Ergocalciferol, (DRISDOL) 1.25 MG (50000 UT) CAPS capsule Take 1 capsule (50,000 Units total) by mouth every 7 (seven) days.  . [DISCONTINUED] azithromycin (ZITHROMAX) 250 MG tablet Take as directed (Patient not taking: Reported on 05/29/2019)   No facility-administered encounter medications on file as of 05/29/2019.     Surgical History: History reviewed. No pertinent surgical history.  Medical History: Past Medical History:  Diagnosis Date  . Anxiety     Family History: Family History  Problem Relation Age of Onset  . Healthy Mother   . Healthy Father     Social History   Socioeconomic History  . Marital status: Married    Spouse name: Not on file  . Number of children: Not on file  . Years of education: Not on file  . Highest education level: Not on file  Occupational History  . Not on file  Social Needs  . Financial resource strain: Not on file  . Food insecurity    Worry: Not on file    Inability: Not on file  . Transportation needs    Medical: Not on file    Non-medical: Not on file  Tobacco Use  . Smoking  status: Former Smoker    Packs/day: 1.00    Years: 10.00    Pack years: 10.00    Types: Cigarettes    Quit date: 11/28/2016    Years since quitting: 2.4  . Smokeless tobacco: Never Used  Substance and Sexual Activity  . Alcohol use: No  . Drug use: No  . Sexual activity: Not on file  Lifestyle  . Physical activity    Days per week: Not on file    Minutes per session: Not on file  . Stress: Not on file  Relationships  . Social Musicianconnections    Talks on phone: Not on file    Gets together: Not on file    Attends religious service: Not on file     Active member of club or organization: Not on file    Attends meetings of clubs or organizations: Not on file    Relationship status: Not on file  . Intimate partner violence    Fear of current or ex partner: Not on file    Emotionally abused: Not on file    Physically abused: Not on file    Forced sexual activity: Not on file  Other Topics Concern  . Not on file  Social History Narrative  . Not on file      Review of Systems  Constitutional: Negative for activity change, chills, fatigue and unexpected weight change.  HENT: Negative for congestion, postnasal drip, rhinorrhea, sneezing and sore throat.   Respiratory: Negative for cough, chest tightness and shortness of breath.   Cardiovascular: Negative for chest pain and palpitations.  Gastrointestinal: Negative for abdominal pain, constipation, diarrhea, nausea and vomiting.  Musculoskeletal: Negative for arthralgias, back pain, joint swelling and neck pain.  Skin: Positive for rash.  Neurological: Negative.  Negative for tremors and numbness.  Hematological: Negative for adenopathy. Does not bruise/bleed easily.  Psychiatric/Behavioral: Negative for behavioral problems (Depression), sleep disturbance and suicidal ideas. The patient is not nervous/anxious.     Today's Vitals   05/29/19 1020  Height: 5\' 9"  (1.753 m)   Body mass index is 34.41 kg/m.  Observation/Objective:   The patient is alert and oriented. He is pleasant and answering all questions appropriately. Breathing is non-labored. He is in no acute distress. There are widely dispersed, red, round, scabbed lesions on bilateral lower legs .they stretch from below the boot line to over the knee. No evidence if infection noted at this time.    Assessment/Plan: 1. Contact dermatitis and eczema due to plant Poison ivy/poison oak - start prednisone taper. Take as directed for 6 days. Add triamcinolone cream which can be applied to all affected areas twice daily.  Recommended OTC Zenfel wash ti help clear lesions and lessen spread.  - predniSONE (STERAPRED UNI-PAK 21 TAB) 10 MG (21) TBPK tablet; 6 day taper - take by mouth as directed for 6 days  Dispense: 21 tablet; Refill: 0 - triamcinolone (KENALOG) 0.025 % cream; Apply 1 application topically 2 (two) times daily.  Dispense: 80 g; Refill: 2  General Counseling: Ko verbalizes understanding of the findings of today's phone visit and agrees with plan of treatment. I have discussed any further diagnostic evaluation that may be needed or ordered today. We also reviewed his medications today. he has been encouraged to call the office with any questions or concerns that should arise related to todays visit.  This patient was seen by Vincent GrosHeather Teague Goynes FNP Collaboration with Dr Lyndon CodeFozia M Khan as a part of collaborative care agreement  Meds ordered this encounter  Medications  . predniSONE (STERAPRED UNI-PAK 21 TAB) 10 MG (21) TBPK tablet    Sig: 6 day taper - take by mouth as directed for 6 days    Dispense:  21 tablet    Refill:  0    Order Specific Question:   Supervising Provider    Answer:   Lavera Guise [place]  . triamcinolone (KENALOG) 0.025 % cream    Sig: Apply 1 application topically 2 (two) times daily.    Dispense:  80 g    Refill:  2    Order Specific Question:   Supervising Provider    Answer:   Lavera Guise [[id number]]    Time spent: 38 Minutes    Dr Lavera Guise Internal medicine

## 2019-06-05 LAB — SPECIMEN STATUS REPORT

## 2019-06-30 ENCOUNTER — Other Ambulatory Visit: Payer: Self-pay | Admitting: Adult Health

## 2019-06-30 DIAGNOSIS — F419 Anxiety disorder, unspecified: Secondary | ICD-10-CM

## 2019-07-03 ENCOUNTER — Ambulatory Visit: Payer: 59 | Admitting: Nurse Practitioner

## 2019-07-03 ENCOUNTER — Other Ambulatory Visit: Payer: Self-pay

## 2019-07-03 ENCOUNTER — Encounter: Payer: Self-pay | Admitting: Nurse Practitioner

## 2019-07-03 VITALS — Temp 98.4°F | Ht 69.0 in | Wt 230.0 lb

## 2019-07-03 DIAGNOSIS — J069 Acute upper respiratory infection, unspecified: Secondary | ICD-10-CM

## 2019-07-03 DIAGNOSIS — J301 Allergic rhinitis due to pollen: Secondary | ICD-10-CM | POA: Diagnosis not present

## 2019-07-03 MED ORDER — AZITHROMYCIN 250 MG PO TABS: ORAL_TABLET | ORAL | 0 refills | Status: DC

## 2019-07-03 NOTE — Progress Notes (Signed)
[facility] [place], [place] [address]  Internal MEDICINE  Telephone Visit  Patient Name: Alfred Gross  [id number]  [id number]  Date of Service: 07/15/2019  I connected with the patient at 12:43pm by webcam and verified the patients identity using two identifiers.   I discussed the limitations, risks, security and privacy concerns of performing an evaluation and management service by webcam and the availability of in person appointments. I also discussed with the patient that there may be a patient responsible charge related to the service.  The patient expressed understanding and agrees to proceed.    Chief Complaint  Patient presents with  . Telephone Assessment  . Telephone Screen  . Sinusitis    The patient has been contacted via webcam for follow up visit due to concerns for spread of novel coronavirus. The patient states that he has nasal congestion, sinus pain, and headache. He states that he has green mucus from the nose.  He denies fever or nausea and vomiting. He has had intermittent sinus infections. He is generally able to take a z-pack to resolve infection.       Current Medication: Outpatient Encounter Medications as of 07/03/2019  Medication Sig  . albuterol (PROVENTIL HFA;VENTOLIN HFA) 108 (90 Base) MCG/ACT inhaler Inhale 2 puffs into the lungs every 6 (six) hours as needed for wheezing or shortness of breath.  . cetirizine (ZYRTEC) 10 MG tablet Take 10 mg by mouth daily.  . citalopram (CELEXA) 10 MG tablet TAKE 1 TABLET BY MOUTH EVERY DAY  . clotrimazole-betamethasone (LOTRISONE) cream Apply 1 application topically 2 (two) times daily. Apply extrernaly bid to feet prn  . fluticasone (FLONASE) 50 MCG/ACT nasal spray Place 2 sprays into both nostrils 2 (two) times a day.  Marland Kitchen omeprazole (PRILOSEC) 40 MG capsule Take 1 capsule (40 mg total) by mouth daily.  Marland Kitchen triamcinolone (KENALOG) 0.025 % cream Apply 1 application topically 2 (two) times  daily.  . Vitamin D, Ergocalciferol, (DRISDOL) 1.25 MG (50000 UT) CAPS capsule Take 1 capsule (50,000 Units total) by mouth every 7 (seven) days.  . [DISCONTINUED] predniSONE (STERAPRED UNI-PAK 21 TAB) 10 MG (21) TBPK tablet 6 day taper - take by mouth as directed for 6 days  . azithromycin (ZITHROMAX) 250 MG tablet z-pack - take as directed for 5 days for sinusitis.  . [DISCONTINUED] metroNIDAZOLE (FLAGYL) 500 MG tablet Take 1 tablet (500 mg total) by mouth 3 (three) times daily. (Patient not taking: Reported on 07/03/2019)   No facility-administered encounter medications on file as of 07/03/2019.     Surgical History: History reviewed. No pertinent surgical history.  Medical History: Past Medical History:  Diagnosis Date  . Anxiety     Family History: Family History  Problem Relation Age of Onset  . Healthy Mother   . Healthy Father     Social History   Socioeconomic History  . Marital status: Married    Spouse name: Not on file  . Number of children: Not on file  . Years of education: Not on file  . Highest education level: Not on file  Occupational History  . Not on file  Social Needs  . Financial resource strain: Not on file  . Food insecurity    Worry: Not on file    Inability: Not on file  . Transportation needs    Medical: Not on file    Non-medical: Not on file  Tobacco Use  . Smoking status: Former Smoker    Packs/day: 1.00  Years: 10.00    Pack years: 10.00    Types: Cigarettes    Quit date: 11/28/2016    Years since quitting: 2.6  . Smokeless tobacco: Never Used  Substance and Sexual Activity  . Alcohol use: No  . Drug use: No  . Sexual activity: Not on file  Lifestyle  . Physical activity    Days per week: Not on file    Minutes per session: Not on file  . Stress: Not on file  Relationships  . Social Musician on phone: Not on file    Gets together: Not on file    Attends religious service: Not on file    Active member of club or  organization: Not on file    Attends meetings of clubs or organizations: Not on file    Relationship status: Not on file  . Intimate partner violence    Fear of current or ex partner: Not on file    Emotionally abused: Not on file    Physically abused: Not on file    Forced sexual activity: Not on file  Other Topics Concern  . Not on file  Social History Narrative  . Not on file      Review of Systems  Constitutional: Positive for fatigue. Negative for chills and unexpected weight change.  HENT: Positive for congestion, postnasal drip, rhinorrhea, sinus pressure and sinus pain. Negative for ear pain, hearing loss, sneezing and sore throat.   Respiratory: Negative for cough, choking, chest tightness, shortness of breath and wheezing.   Cardiovascular: Negative for chest pain and palpitations.  Gastrointestinal: Negative.  Negative for abdominal pain, constipation, diarrhea, nausea and vomiting.  Musculoskeletal: Negative for arthralgias, back pain, joint swelling and neck pain.  Skin: Negative.  Negative for rash.  Allergic/Immunologic: Positive for environmental allergies.  Neurological: Negative for tremors and numbness.  Hematological: Negative for adenopathy. Does not bruise/bleed easily.  Psychiatric/Behavioral: Negative for behavioral problems, sleep disturbance and suicidal ideas. The patient is not nervous/anxious.    Today's Vitals   07/03/19 1139  Temp: 98.4 F (36.9 C)  Weight: 230 lb (104.3 kg)  Height: 5\' 9"  (1.753 m)   Body mass index is 33.97 kg/m.   Observation/Objective:  The patient is alert and oriented. He is pleasant and answering all questions appropriately. Breathing is non-labored. He is in no acute distress.  The patient is nasally congested.    Assessment/Plan:  1. Upper respiratory tract infection, unspecified type Start z-pack. Take as directed for 5 days. Rest and increase fluids. Use OTC medications to treat acute symptoms.  - azithromycin  (ZITHROMAX) 250 MG tablet; z-pack - take as directed for 5 days for sinusitis.  Dispense: 6 tablet; Refill: 0  2. Allergic rhinitis due to pollen, unspecified seasonality Continue allergy medications and nasal sprays as prescribed.    General Counseling: thorsten benninghoff understanding of the findings of today's phone visit and agrees with plan of treatment. I have discussed any further diagnostic evaluation that may be needed or ordered today. We also reviewed his medications today. he has been encouraged to call the office with any questions or concerns that should arise related to todays visit.  Rest and increase fluids. Continue using OTC medication to control symptoms.   This patient was seen by Vincent Gros FNP Collaboration with Dr Lyndon Code as a part of collaborative care agreement  Meds ordered this encounter  Medications  . azithromycin (ZITHROMAX) 250 MG tablet    Sig: z-pack -  take as directed for 5 days for sinusitis.    Dispense:  6 tablet    Refill:  0    Order Specific Question:   Supervising Provider    Answer:   Lyndon CodeKHAN, FOZIA M [[id number]]    Time spent: 7515 Minutes    Dr Lyndon CodeFozia M Khan Internal medicine

## 2019-07-21 ENCOUNTER — Other Ambulatory Visit: Payer: Self-pay | Admitting: Nurse Practitioner

## 2019-07-21 DIAGNOSIS — J0191 Acute recurrent sinusitis, unspecified: Secondary | ICD-10-CM

## 2019-07-21 DIAGNOSIS — J069 Acute upper respiratory infection, unspecified: Secondary | ICD-10-CM

## 2019-07-21 MED ORDER — AZITHROMYCIN 250 MG PO TABS: ORAL_TABLET | ORAL | 0 refills | Status: DC

## 2019-07-21 MED ORDER — PREDNISONE 10 MG (21) PO TBPK: ORAL_TABLET | ORAL | 0 refills | Status: DC

## 2019-07-21 NOTE — Progress Notes (Signed)
Symptoms of sinus infection returned after finishing treatment of z-pack. Will repeat this. Add prednisone taper. Take as directed for 6 days. Both prescriptions sent to his pharmacy.

## 2019-08-07 LAB — VISTASEQ HR COLORECTAL CANCER: Result Summary: NEGATIVE

## 2019-08-19 ENCOUNTER — Other Ambulatory Visit: Payer: Self-pay

## 2019-08-19 DIAGNOSIS — F419 Anxiety disorder, unspecified: Secondary | ICD-10-CM

## 2019-08-19 MED ORDER — CITALOPRAM HYDROBROMIDE 10 MG PO TABS: 10.0000 mg | ORAL_TABLET | Freq: Every day | ORAL | 0 refills | Status: DC

## 2019-08-21 ENCOUNTER — Other Ambulatory Visit: Payer: Self-pay

## 2019-08-21 ENCOUNTER — Encounter: Payer: Self-pay | Admitting: Nurse Practitioner

## 2019-08-21 ENCOUNTER — Ambulatory Visit: Payer: 59 | Admitting: Nurse Practitioner

## 2019-08-21 VITALS — Temp 97.9°F | Ht 69.0 in | Wt 235.0 lb

## 2019-08-21 DIAGNOSIS — J301 Allergic rhinitis due to pollen: Secondary | ICD-10-CM | POA: Diagnosis not present

## 2019-08-21 DIAGNOSIS — J0191 Acute recurrent sinusitis, unspecified: Secondary | ICD-10-CM

## 2019-08-21 MED ORDER — DOXYCYCLINE HYCLATE 100 MG PO TABS: 100.0000 mg | ORAL_TABLET | Freq: Two times a day (BID) | ORAL | 0 refills | Status: DC

## 2019-08-21 NOTE — Progress Notes (Signed)
[facility] [address] [place], [place] [address]  Internal MEDICINE  Telephone Visit  Patient Name: Alfred CenterChristopher Yaffe  [id number] 13, 1992  [id number]  Date of Service: 09/09/2019  I connected with the patient at 4:09pm by webcam and verified the patients identity using two identifiers.   I discussed the limitations, risks, security and privacy concerns of performing an evaluation and management service by webcam and the availability of in person appointments. I also discussed with the patient that there may be a patient responsible charge related to the service.  The patient expressed understanding and agrees to proceed.    Chief Complaint  Patient presents with  . Telephone Assessment  . Telephone Screen  . Sinusitis  . Ear Pain    right ear     The patient has been contacted via telephone for follow up visit due to concerns for spread of novel coronavirus. Today, he is complaining of sinus congestion, ear pain, scratchy throat, and cough. Symptoms have been present for several days. Gradually getting worse. He denies fever, headache, or hills. He has history of getting frequent sinus infections in the fall and spring.       Current Medication: Outpatient Encounter Medications as of 08/21/2019  Medication Sig  . albuterol (PROVENTIL HFA;VENTOLIN HFA) 108 (90 Base) MCG/ACT inhaler Inhale 2 puffs into the lungs every 6 (six) hours as needed for wheezing or shortness of breath.  . cetirizine (ZYRTEC) 10 MG tablet Take 10 mg by mouth daily.  . citalopram (CELEXA) 10 MG tablet Take 1 tablet (10 mg total) by mouth daily.  . clotrimazole-betamethasone (LOTRISONE) cream Apply 1 application topically 2 (two) times daily. Apply extrernaly bid to feet prn  . fluticasone (FLONASE) 50 MCG/ACT nasal spray Place 2 sprays into both nostrils 2 (two) times a day.  Marland Kitchen. omeprazole (PRILOSEC) 40 MG capsule Take 1 capsule (40 mg total) by mouth daily.  Marland Kitchen. triamcinolone (KENALOG) 0.025 % cream  Apply 1 application topically 2 (two) times daily.  . Vitamin D, Ergocalciferol, (DRISDOL) 1.25 MG (50000 UT) CAPS capsule Take 1 capsule (50,000 Units total) by mouth every 7 (seven) days.  Marland Kitchen. doxycycline (VIBRA-TABS) 100 MG tablet Take 1 tablet (100 mg total) by mouth 2 (two) times daily.  . [DISCONTINUED] azithromycin (ZITHROMAX) 250 MG tablet z-pack - take as directed for 5 days for sinusitis. (Patient not taking: Reported on 08/21/2019)  . [DISCONTINUED] predniSONE (STERAPRED UNI-PAK 21 TAB) 10 MG (21) TBPK tablet 6 day taper - take by mouth as directed for 6 days (Patient not taking: Reported on 08/21/2019)   No facility-administered encounter medications on file as of 08/21/2019.     Surgical History: History reviewed. No pertinent surgical history.  Medical History: Past Medical History:  Diagnosis Date  . Anxiety     Family History: Family History  Problem Relation Age of Onset  . Healthy Mother   . Healthy Father     Social History   Socioeconomic History  . Marital status: Married    Spouse name: Not on file  . Number of children: Not on file  . Years of education: Not on file  . Highest education level: Not on file  Occupational History  . Not on file  Social Needs  . Financial resource strain: Not on file  . Food insecurity    Worry: Not on file    Inability: Not on file  . Transportation needs    Medical: Not on file    Non-medical: Not on file  Tobacco Use  . Smoking status: Former Smoker    Packs/day: 1.00    Years: 10.00    Pack years: 10.00    Types: Cigarettes    Quit date: 11/28/2016    Years since quitting: 2.7  . Smokeless tobacco: Never Used  Substance and Sexual Activity  . Alcohol use: No  . Drug use: No  . Sexual activity: Not on file  Lifestyle  . Physical activity    Days per week: Not on file    Minutes per session: Not on file  . Stress: Not on file  Relationships  . Social Herbalist on phone: Not on file    Gets  together: Not on file    Attends religious service: Not on file    Active member of club or organization: Not on file    Attends meetings of clubs or organizations: Not on file    Relationship status: Not on file  . Intimate partner violence    Fear of current or ex partner: Not on file    Emotionally abused: Not on file    Physically abused: Not on file    Forced sexual activity: Not on file  Other Topics Concern  . Not on file  Social History Narrative  . Not on file      Review of Systems  Constitutional: Positive for fatigue. Negative for chills, fever and unexpected weight change.  HENT: Positive for congestion, postnasal drip, rhinorrhea, sinus pressure and sinus pain. Negative for ear pain, hearing loss, sneezing and sore throat.   Respiratory: Positive for cough. Negative for choking, chest tightness, shortness of breath and wheezing.   Cardiovascular: Negative for chest pain and palpitations.  Gastrointestinal: Negative for abdominal pain, constipation, diarrhea, nausea and vomiting.  Musculoskeletal: Negative for arthralgias, back pain, joint swelling and neck pain.  Skin: Negative.  Negative for rash.  Allergic/Immunologic: Positive for environmental allergies.  Neurological: Negative for dizziness, tremors, numbness and headaches.  Hematological: Negative for adenopathy. Does not bruise/bleed easily.  Psychiatric/Behavioral: Negative for behavioral problems, sleep disturbance and suicidal ideas. The patient is not nervous/anxious.     Today's Vitals   08/21/19 1548  Temp: 97.9 F (36.6 C)  Weight: 235 lb (106.6 kg)  Height: 5\' 9"  (1.753 m)   Body mass index is 34.7 kg/m.  Observation/Objective:  The patient is alert and oriented. He is pleasant and answering all questions appropriately. Breathing is non-labored. He is in no acute distress.   the patient is nasally congested. He appears to feel poorly today.   Assessment/Plan:  1. Acute recurrent sinusitis,  unspecified location Will try round of doxycycline 100g bid for 10 days. Rest and increase fluids. Recommend TOC medications as needed and as indicated to alleviate acute symptoms.  - doxycycline (VIBRA-TABS) 100 MG tablet; Take 1 tablet (100 mg total) by mouth 2 (two) times daily.  Dispense: 28 tablet; Refill: 0  2. Allergic rhinitis due to pollen, unspecified seasonality Continue cetirizine and flonase daily.   General Counseling: devanta daniel understanding of the findings of today's phone visit and agrees with plan of treatment. I have discussed any further diagnostic evaluation that may be needed or ordered today. We also reviewed his medications today. he has been encouraged to call the office with any questions or concerns that should arise related to todays visit.   This patient was seen by Leretha Pol FNP Collaboration with Dr Lavera Guise as a part of collaborative care agreement  Meds  ordered this encounter  Medications  . doxycycline (VIBRA-TABS) 100 MG tablet    Sig: Take 1 tablet (100 mg total) by mouth 2 (two) times daily.    Dispense:  28 tablet    Refill:  0    Order Specific Question:   Supervising Provider    Answer:   Lyndon Code [[id number]]    Time spent: 13 Minutes    Dr Lyndon Code Internal medicine

## 2019-09-09 DIAGNOSIS — J0191 Acute recurrent sinusitis, unspecified: Secondary | ICD-10-CM | POA: Insufficient documentation

## 2019-09-15 ENCOUNTER — Telehealth: Payer: Self-pay

## 2019-09-15 NOTE — Telephone Encounter (Signed)
Rescheduled appointment for 09/17/2019 to 10/05/2019 @ 4:15pm. klh

## 2019-09-17 ENCOUNTER — Ambulatory Visit: Payer: 59 | Admitting: Adult Health

## 2019-10-01 ENCOUNTER — Inpatient Hospital Stay: Payer: Managed Care, Other (non HMO) | Attending: Genetic Counselor | Admitting: Licensed Clinical Social Worker

## 2019-10-01 ENCOUNTER — Encounter: Payer: Self-pay | Admitting: Licensed Clinical Social Worker

## 2019-10-01 ENCOUNTER — Telehealth: Payer: Self-pay

## 2019-10-01 DIAGNOSIS — Z8042 Family history of malignant neoplasm of prostate: Secondary | ICD-10-CM | POA: Diagnosis not present

## 2019-10-01 DIAGNOSIS — Z806 Family history of leukemia: Secondary | ICD-10-CM | POA: Diagnosis not present

## 2019-10-01 DIAGNOSIS — Z800 Family history of malignant neoplasm of digestive organs: Secondary | ICD-10-CM | POA: Diagnosis not present

## 2019-10-01 DIAGNOSIS — Z1379 Encounter for other screening for genetic and chromosomal anomalies: Secondary | ICD-10-CM

## 2019-10-01 NOTE — Progress Notes (Signed)
REFERRING PROVIDER: Self referred  PRIMARY PROVIDER:  Ronnell Freshwater, NP  PRIMARY REASON FOR VISIT:  1. Family history of colon cancer   2. Family history of prostate cancer   3. Family history of Lynch syndrome   4. Family history of leukemia     I connected with Alfred Gross pm 10/01/2019 at 1:00 PM EDT by MyChart video and verified that I am speaking with the correct person using two identifiers.    Patient location: home Provider location: clinic  HISTORY OF PRESENT ILLNESS:   [name], a 28 y.o. male, was seen for a [place] cancer genetics consultation due to his father's recent genetic testing that revealed an MSH2 pathogenic variant and CHEK2 pathogenic variant.  Alfred Gross presents to clinic today to discuss the possibility of a hereditary predisposition to cancer, genetic testing, and to further clarify his future cancer risks, as well as potential cancer risks for family members.    Alfred Gross is a 28 y.o. male with no personal history of cancer. He reports having a colonoscopy about 5 years ago and that about 7-8 polyps were found at that time. He reports having an upper endoscopy a few years ago for heartburn that was normal. No prostate cancer screening. He did have genetic testing for Lynch syndrome through his PCP that was reportedly negative, but report not available for review today.    CANCER HISTORY:  Oncology History   No history exists.    Past Medical History:  Diagnosis Date  . Family history of colon cancer   . Family history of leukemia   . Family history of Lynch syndrome   . Family history of prostate cancer     No past surgical history on file.  Social History   Socioeconomic History  . Marital status: Married    Spouse name: Not on file  . Number of children: Not on file  . Years of education: Not on file  . Highest education level: Not on file  Occupational History  . Not on file  Social Needs  . Financial resource strain:  Not on file  . Food insecurity    Worry: Not on file    Inability: Not on file  . Transportation needs    Medical: Not on file    Non-medical: Not on file  Tobacco Use  . Smoking status: Former Smoker    Packs/day: 1.00    Years: 10.00    Pack years: 10.00    Types: Cigarettes    Quit date: 11/28/2016    Years since quitting: 2.8  . Smokeless tobacco: Never Used  Substance and Sexual Activity  . Alcohol use: No  . Drug use: No  . Sexual activity: Not on file  Lifestyle  . Physical activity    Days per week: Not on file    Minutes per session: Not on file  . Stress: Not on file  Relationships  . Social Herbalist on phone: Not on file    Gets together: Not on file    Attends religious service: Not on file    Active member of club or organization: Not on file    Attends meetings of clubs or organizations: Not on file    Relationship status: Not on file  Other Topics Concern  . Not on file  Social History Narrative  . Not on file     FAMILY HISTORY:  We obtained a detailed, 4-generation family history.  Significant diagnoses  are listed below: Family History  Problem Relation Age of Onset  . Healthy Mother   . Colon cancer Father        MSH2+ Lynch syndrome, CHEK2+  . Colon cancer Paternal Uncle 56  . Colon cancer Paternal Grandfather        "Lynch+"  . Prostate cancer Paternal Grandfather        dx 39s  . Leukemia Half-Brother 16  . Colon cancer Half-Brother 17       "Lynch+"   [name] has a son, 20, and a daughter, 3. He has 1 paternal half brother, [name], and 2 paternal half sisters. [name] had leukemia at 70 and colon cancer at 38 and has reportedly tested positive for Lynch syndrome. Patient's pat half sister [name] is working on getting testing.   [name] father, [name], was tested and found to have an MSH2 pathogenic variant and CHEK2 likely pathogenic variant. [name] was diagnosed with colon cancer at 56. Patient had 2 paternal uncles.  One had colon cancer at 61 and never had testing, he is deceased. His other uncle has not had cancer but has reportedly tested positive for Lynch syndrome. Paternal grandfather was first to have genetic testing and reportedly tested positive for Lynch and had colon and prostate cancer in his 84s. Paternal grandmother died recently at 79 of dementia.  [name] mother is living at 57 with no history of cancer. Patient has 1 maternal uncle and 1 maternal cousin, no cancers. Maternal grandmother is living at 10 and limited information about maternal grandfather.  [name] is aware of previous family history of genetic testing for hereditary cancer risks. Patient's maternal ancestors are of unknown descent, and paternal ancestors are of [place] descent. There is no reported Ashkenazi Jewish ancestry. There is no known consanguinity.  GENETIC COUNSELING ASSESSMENT: [name] is a 28 y.o. 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 We, therefore, discussed and recommended the following at today's visit.   DISCUSSION: We discussed that 5 - 10% of colon cancer is hereditary, with most cases associated with Lynch syndrome.  We discussed Lynch syndrome in detail including cancer risks and management. We also discussed the CHEK2 gene. We discussed that testing is beneficial for several reasons including knowing how to follow individuals for cancer screenings and understand if other family members could be at risk for cancer and allow them to undergo genetic testing.   We reviewed the characteristics, features and inheritance patterns of hereditary cancer syndromes. We also discussed genetic testing, including the appropriate family members to test, the process of testing, insurance coverage and turn-around-time for results. We discussed the implications of a negative, positive and/or variant of uncertain significant result. We recommended [name] pursue familial  variant testing for the 2 known variants in his father, in the MSH2 gene and CHEK2 gene.  Based on [name] family history of cancer and Lynch syndrome he meets medical criteria for genetic testing.The lab provides free testing to family members within 150 days of their family members' report date so this test will be at no cost to the patient.   PLAN: After considering the risks, benefits, and limitations, Mr. Pasquarello provided informed consent to pursue genetic testing. A saliva kit will be mailed to Mr. Duque and he will send it to [facility] for analysis of the family variants in MSH2 and Tangent. Results should be available within approximately 2-3 weeks' time, at which point they will be  disclosed by telephone to Mr. Uptain, as will any additional recommendations warranted by these results. Mr. Repsher will receive a summary of his genetic counseling visit and a copy of his results once available. This information will also be available in Epic.   Based on Mr. Goethe family history, we recommended his paternal relatives have genetic counseling and testing. Mr. Kampf will let us know if we can be of any assistance in coordinating genetic counseling and/or testing for this family member.   Mr. Vanroekel questions were answered to his satisfaction today. Our contact information was provided should additional questions or concerns arise. Thank you for the referral and allowing [place] to share in the care of your patient.   Faith Rogue, MS, [facility] Genetic Counselor [email or web address] Phone: [phone]  The patient was seen for a total of 35 minutes in face-to-face genetic counseling.  Drs. Magrinat, Lindi Adie and/or Burr Medico were available for discussion regarding this case.   _______________________________________________________________________ For Office Staff:  Number of people involved in session: 1 Was an Intern/ student involved with case: no

## 2019-10-01 NOTE — Telephone Encounter (Signed)
Confirmed appointment with patient. klh °

## 2019-10-05 ENCOUNTER — Ambulatory Visit: Payer: 59 | Admitting: Adult Health

## 2019-10-05 ENCOUNTER — Other Ambulatory Visit: Payer: Self-pay

## 2019-10-05 ENCOUNTER — Encounter: Payer: Self-pay | Admitting: Adult Health

## 2019-10-05 DIAGNOSIS — F411 Generalized anxiety disorder: Secondary | ICD-10-CM

## 2019-10-05 DIAGNOSIS — Z0000 Encounter for general adult medical examination without abnormal findings: Secondary | ICD-10-CM

## 2019-10-05 DIAGNOSIS — E559 Vitamin D deficiency, unspecified: Secondary | ICD-10-CM

## 2019-10-05 DIAGNOSIS — F419 Anxiety disorder, unspecified: Secondary | ICD-10-CM

## 2019-10-05 DIAGNOSIS — K219 Gastro-esophageal reflux disease without esophagitis: Secondary | ICD-10-CM

## 2019-10-05 MED ORDER — CITALOPRAM HYDROBROMIDE 10 MG PO TABS: 10.0000 mg | ORAL_TABLET | Freq: Every day | ORAL | 0 refills | Status: DC

## 2019-10-05 MED ORDER — OMEPRAZOLE 40 MG PO CPDR: 40.0000 mg | DELAYED_RELEASE_CAPSULE | Freq: Every day | ORAL | 1 refills | Status: DC

## 2019-10-05 NOTE — Progress Notes (Addendum)
[facility] [place], [place] [address]  Internal MEDICINE  Office Visit Note  Patient Name: Alfred Gross  [id number]  [id number]  Date of Service: 10/26/2019  Chief Complaint  Patient presents with  . Medical Management of Chronic Issues    6 month follow up     HPI Patient is here for routine follow-up on his chronic medical conditions. No issues to report today, no recent medication changes or new medical diagnoses. At his last routine follow-up vitamin D levels were low, replaced with vitamin D supplements, will follow-up on levels. Alfred Gross is in the process of having genetic testing done due to many paternal relatives having cancer. Followed by a Dietitian.     Current Medication: Outpatient Encounter Medications as of 10/05/2019  Medication Sig  . albuterol (PROVENTIL HFA;VENTOLIN HFA) 108 (90 Base) MCG/ACT inhaler Inhale 2 puffs into the lungs every 6 (six) hours as needed for wheezing or shortness of breath.  . cetirizine (ZYRTEC) 10 MG tablet Take 10 mg by mouth daily.  . citalopram (CELEXA) 10 MG tablet Take 1 tablet (10 mg total) by mouth daily.  . clotrimazole-betamethasone (LOTRISONE) cream Apply 1 application topically 2 (two) times daily. Apply extrernaly bid to feet prn  . fluticasone (FLONASE) 50 MCG/ACT nasal spray Place 2 sprays into both nostrils 2 (two) times a day.  Marland Kitchen omeprazole (PRILOSEC) 40 MG capsule Take 1 capsule (40 mg total) by mouth daily.  Marland Kitchen triamcinolone (KENALOG) 0.025 % cream Apply 1 application topically 2 (two) times daily.  . Vitamin D, Ergocalciferol, (DRISDOL) 1.25 MG (50000 UT) CAPS capsule Take 1 capsule (50,000 Units total) by mouth every 7 (seven) days.  . [DISCONTINUED] citalopram (CELEXA) 10 MG tablet Take 1 tablet (10 mg total) by mouth daily.  . [DISCONTINUED] omeprazole (PRILOSEC) 40 MG capsule Take 1 capsule (40 mg total) by mouth daily.  . [DISCONTINUED] doxycycline (VIBRA-TABS) 100 MG  tablet Take 1 tablet (100 mg total) by mouth 2 (two) times daily. (Patient not taking: Reported on 10/05/2019)   No facility-administered encounter medications on file as of 10/05/2019.    Surgical History: History reviewed. No pertinent surgical history.  Medical History: Past Medical History:  Diagnosis Date  . Family history of colon cancer   . Family history of leukemia   . Family history of Lynch syndrome   . Family history of prostate cancer     Family History: Family History  Problem Relation Age of Onset  . Healthy Mother   . Colon cancer Father        MSH2+ Lynch syndrome, CHEK2+  . Colon cancer Paternal Uncle 18  . Colon cancer Paternal Grandfather        "Lynch+"  . Prostate cancer Paternal Grandfather        dx 25s  . Leukemia Half-Brother 16  . Colon cancer Half-Brother 19       "Lynch+"    Social History   Socioeconomic History  . Marital status: Married    Spouse name: Not on file  . Number of children: Not on file  . Years of education: Not on file  . Highest education level: Not on file  Occupational History  . Not on file  Tobacco Use  . Smoking status: Former Smoker    Packs/day: 1.00    Years: 10.00    Pack years: 10.00    Types: Cigarettes    Quit date: 11/28/2016    Years since quitting: 2.9  . Smokeless tobacco:  Never Used  Substance and Sexual Activity  . Alcohol use: No  . Drug use: No  . Sexual activity: Not on file  Other Topics Concern  . Not on file  Social History Narrative  . Not on file   Social Determinants of Health   Financial Resource Strain:   . Difficulty of Paying Living Expenses: Not on file  Food Insecurity:   . Worried About Charity fundraiser in the Last Year: Not on file  . Ran Out of Food in the Last Year: Not on file  Transportation Needs:   . Lack of Transportation (Medical): Not on file  . Lack of Transportation (Non-Medical): Not on file  Physical Activity:   . Days of Exercise per Week: Not on file   . Minutes of Exercise per Session: Not on file  Stress:   . Feeling of Stress : Not on file  Social Connections:   . Frequency of Communication with Friends and Family: Not on file  . Frequency of Social Gatherings with Friends and Family: Not on file  . Attends Religious Services: Not on file  . Active Member of Clubs or Organizations: Not on file  . Attends Archivist Meetings: Not on file  . Marital Status: Not on file  Intimate Partner Violence:   . Fear of Current or Ex-Partner: Not on file  . Emotionally Abused: Not on file  . Physically Abused: Not on file  . Sexually Abused: Not on file      Review of Systems  Constitutional: Negative.  Negative for chills, fatigue and unexpected weight change.  HENT: Negative.  Negative for congestion, rhinorrhea, sneezing and sore throat.   Eyes: Negative for redness.  Respiratory: Negative.  Negative for cough, chest tightness and shortness of breath.   Cardiovascular: Negative.  Negative for chest pain and palpitations.  Gastrointestinal: Negative.  Negative for abdominal pain, constipation, diarrhea, nausea and vomiting.  Endocrine: Negative.   Genitourinary: Negative.  Negative for dysuria and frequency.  Musculoskeletal: Negative.  Negative for arthralgias, back pain, joint swelling and neck pain.  Skin: Negative.  Negative for rash.  Allergic/Immunologic: Negative.   Neurological: Negative.  Negative for tremors and numbness.  Hematological: Negative for adenopathy. Does not bruise/bleed easily.  Psychiatric/Behavioral: Negative.  Negative for behavioral problems, sleep disturbance and suicidal ideas. The patient is not nervous/anxious.     Vital Signs: BP 130/80   Pulse 93   Temp 98 F (36.7 C)   Resp 16   Ht 5' 9"  (1.753 m)   Wt 242 lb (109.8 kg)   SpO2 97%   BMI 35.74 kg/m    Physical Exam Vitals signs and nursing note reviewed.  Constitutional:      General: He is not in acute distress.     Appearance: He is well-developed. He is not diaphoretic.  HENT:     Head: Normocephalic and atraumatic.     Mouth/Throat:     Pharynx: No oropharyngeal exudate.  Eyes:     Pupils: Pupils are equal, round, and reactive to light.  Neck:     Musculoskeletal: Normal range of motion and neck supple.     Thyroid: No thyromegaly.     Vascular: No JVD.     Trachea: No tracheal deviation.  Cardiovascular:     Rate and Rhythm: Normal rate and regular rhythm.     Heart sounds: Normal heart sounds. No murmur. No friction rub. No gallop.   Pulmonary:     Effort:  Pulmonary effort is normal. No respiratory distress.     Breath sounds: Normal breath sounds. No wheezing or rales.  Chest:     Chest wall: No tenderness.  Abdominal:     Palpations: Abdomen is soft.     Tenderness: There is no abdominal tenderness. There is no guarding.  Musculoskeletal: Normal range of motion.  Lymphadenopathy:     Cervical: No cervical adenopathy.  Skin:    General: Skin is warm and dry.  Neurological:     Mental Status: He is alert and oriented to person, place, and time.     Cranial Nerves: No cranial nerve deficit.  Psychiatric:        Behavior: Behavior normal.        Thought Content: Thought content normal.        Judgment: Judgment normal.     Assessment/Plan: 1. Vitamin D deficiency Follow-up due to low levels of Vitamin D 6 months prior. - Vitamin D (25 hydroxy)  2. Anxiety Stable on current therapy, continue to monitor. - citalopram (CELEXA) 10 MG tablet; Take 1 tablet (10 mg total) by mouth daily.  Dispense: 30 tablet; Refill: 0  3. Gastroesophageal reflux disease without esophagitis Stable on current therapy, continue to monitor. - omeprazole (PRILOSEC) 40 MG capsule; Take 1 capsule (40 mg total) by mouth daily.  Dispense: 90 capsule; Refill: 1  4. Encounter for general health examination - CBC with Differential/Platelet - Lipid Panel With LDL/HDL Ratio - TSH - T4, free - Comprehensive  metabolic panel - P00 and Folate Panel   General Counseling: Jago verbalizes understanding of the findings of todays visit and agrees with plan of treatment. I have discussed any further diagnostic evaluation that may be needed or ordered today. We also reviewed his medications today. he has been encouraged to call the office with any questions or concerns that should arise related to todays visit.  Orders Placed This Encounter  Procedures  . Vitamin D (25 hydroxy)  . CBC with Differential/Platelet  . Lipid Panel With LDL/HDL Ratio  . TSH  . T4, free  . Comprehensive metabolic panel  . B12 and Folate Panel    Meds ordered this encounter  Medications  . citalopram (CELEXA) 10 MG tablet    Sig: Take 1 tablet (10 mg total) by mouth daily.    Dispense:  30 tablet    Refill:  0    Need follow up appt  . omeprazole (PRILOSEC) 40 MG capsule    Sig: Take 1 capsule (40 mg total) by mouth daily.    Dispense:  90 capsule    Refill:  1    Time spent: 25 Minutes This patient was seen by Orson Gear AGNP-C in Collaboration with Dr Lavera Guise as a part of collaborative care agreement     Kendell Bane AGNP-C Internal medicine

## 2019-10-07 ENCOUNTER — Telehealth: Payer: Self-pay

## 2019-10-07 NOTE — Telephone Encounter (Signed)
PAPERWORK FAXED TO [facility] AT [phone] FOR PT FOR GENETIC TESTING.

## 2019-11-05 ENCOUNTER — Telehealth: Payer: Self-pay | Admitting: Licensed Clinical Social Worker

## 2019-11-05 NOTE — Telephone Encounter (Signed)
Called Mr. Tallon to let him know that his sample did arrive at the genetic testing lab but that they could not run it since it wasn't labeled with his name and DOB. They are sending another one out for him to do the sample again.

## 2019-11-19 ENCOUNTER — Telehealth: Payer: Self-pay

## 2019-11-19 NOTE — Telephone Encounter (Signed)
Rescheduled appointment on 11/23/2019 to 12/07/2019 patient was exposed to covid. klh

## 2019-11-23 ENCOUNTER — Encounter: Payer: Managed Care, Other (non HMO) | Admitting: Adult Health

## 2019-12-03 ENCOUNTER — Telehealth: Payer: Self-pay

## 2019-12-03 NOTE — Telephone Encounter (Signed)
Confirmed 12/07/2019 appointment and screened for covid. klh 

## 2019-12-07 ENCOUNTER — Other Ambulatory Visit: Payer: Self-pay

## 2019-12-07 ENCOUNTER — Ambulatory Visit (INDEPENDENT_AMBULATORY_CARE_PROVIDER_SITE_OTHER): Payer: 59 | Admitting: Adult Health

## 2019-12-07 ENCOUNTER — Encounter: Payer: Self-pay | Admitting: Adult Health

## 2019-12-07 VITALS — BP 135/86 | HR 90 | Temp 97.6°F | Resp 16 | Ht 69.0 in | Wt 242.2 lb

## 2019-12-07 DIAGNOSIS — F411 Generalized anxiety disorder: Secondary | ICD-10-CM

## 2019-12-07 DIAGNOSIS — R300 Dysuria: Secondary | ICD-10-CM

## 2019-12-07 DIAGNOSIS — Z0001 Encounter for general adult medical examination with abnormal findings: Secondary | ICD-10-CM

## 2019-12-07 DIAGNOSIS — E559 Vitamin D deficiency, unspecified: Secondary | ICD-10-CM

## 2019-12-07 DIAGNOSIS — K219 Gastro-esophageal reflux disease without esophagitis: Secondary | ICD-10-CM

## 2019-12-07 MED ORDER — CITALOPRAM HYDROBROMIDE 10 MG PO TABS: 10.0000 mg | ORAL_TABLET | Freq: Every day | ORAL | 1 refills | Status: DC

## 2019-12-07 NOTE — Progress Notes (Signed)
[facility] [place], [place] [address]  Internal MEDICINE  Office Visit Note  Patient Name: Alfred Gross  [id number]  [id number]  Date of Service: 12/07/2019  Chief Complaint  Patient presents with  . Annual Exam  . Gastroesophageal Reflux     HPI Pt is here for routine health maintenance examination.  He is a well appearing 29 yo male. He has a history of GAD, GERD. He reports he is current well controlled on current medications. He denies any complaints at this time.  He is in need of refills on a few medications at this time. He denies tobacco, alcohol or illicit drug use.  He is up to date on his PHM.   Current Medication: Outpatient Encounter Medications as of 12/07/2019  Medication Sig  . albuterol (PROVENTIL HFA;VENTOLIN HFA) 108 (90 Base) MCG/ACT inhaler Inhale 2 puffs into the lungs every 6 (six) hours as needed for wheezing or shortness of breath.  . cetirizine (ZYRTEC) 10 MG tablet Take 10 mg by mouth daily.  . citalopram (CELEXA) 10 MG tablet Take 1 tablet (10 mg total) by mouth daily.  . clotrimazole-betamethasone (LOTRISONE) cream Apply 1 application topically 2 (two) times daily. Apply extrernaly bid to feet prn  . fluticasone (FLONASE) 50 MCG/ACT nasal spray Place 2 sprays into both nostrils 2 (two) times a day.  Marland Kitchen omeprazole (PRILOSEC) 40 MG capsule Take 1 capsule (40 mg total) by mouth daily.  Marland Kitchen triamcinolone (KENALOG) 0.025 % cream Apply 1 application topically 2 (two) times daily.  . Vitamin D, Ergocalciferol, (DRISDOL) 1.25 MG (50000 UT) CAPS capsule Take 1 capsule (50,000 Units total) by mouth every 7 (seven) days.   No facility-administered encounter medications on file as of 12/07/2019.    Surgical History: History reviewed. No pertinent surgical history.  Medical History: Past Medical History:  Diagnosis Date  . Family history of colon cancer   . Family history of leukemia   . Family history of Lynch syndrome   .  Family history of prostate cancer     Family History: Family History  Problem Relation Age of Onset  . Healthy Mother   . Colon cancer Father        MSH2+ Lynch syndrome, CHEK2+  . Colon cancer Paternal Uncle 72  . Colon cancer Paternal Grandfather        "Lynch+"  . Prostate cancer Paternal Grandfather        dx 72s  . Leukemia Half-Brother 16  . Colon cancer Half-Brother 31       "Lynch+"      Review of Systems  Constitutional: Negative.  Negative for chills, fatigue and unexpected weight change.  HENT: Negative.  Negative for congestion, rhinorrhea, sneezing and sore throat.   Eyes: Negative for redness.  Respiratory: Negative.  Negative for cough, chest tightness and shortness of breath.   Cardiovascular: Negative.  Negative for chest pain and palpitations.  Gastrointestinal: Negative.  Negative for abdominal pain, constipation, diarrhea, nausea and vomiting.  Endocrine: Negative.   Genitourinary: Negative.  Negative for dysuria and frequency.  Musculoskeletal: Negative.  Negative for arthralgias, back pain, joint swelling and neck pain.  Skin: Negative.  Negative for rash.  Allergic/Immunologic: Negative.   Neurological: Negative.  Negative for tremors and numbness.  Hematological: Negative for adenopathy. Does not bruise/bleed easily.  Psychiatric/Behavioral: Negative.  Negative for behavioral problems, sleep disturbance and suicidal ideas. The patient is not nervous/anxious.      Vital Signs: BP 135/86   Pulse 90  Temp 97.6 F (36.4 C)   Resp 16   Ht 5' 9"  (1.753 m)   Wt 242 lb 3.2 oz (109.9 kg)   SpO2 96%   BMI 35.77 kg/m    Physical Exam Vitals and nursing note reviewed.  Constitutional:      General: He is not in acute distress.    Appearance: He is well-developed. He is not diaphoretic.  HENT:     Head: Normocephalic and atraumatic.     Mouth/Throat:     Pharynx: No oropharyngeal exudate.  Eyes:     Pupils: Pupils are equal, round, and  reactive to light.  Neck:     Thyroid: No thyromegaly.     Vascular: No JVD.     Trachea: No tracheal deviation.  Cardiovascular:     Rate and Rhythm: Normal rate and regular rhythm.     Heart sounds: Normal heart sounds. No murmur. No friction rub. No gallop.   Pulmonary:     Effort: Pulmonary effort is normal. No respiratory distress.     Breath sounds: Normal breath sounds. No wheezing or rales.  Chest:     Chest wall: No tenderness.  Abdominal:     Palpations: Abdomen is soft.     Tenderness: There is no abdominal tenderness. There is no guarding.  Musculoskeletal:        General: Normal range of motion.     Cervical back: Normal range of motion and neck supple.  Lymphadenopathy:     Cervical: No cervical adenopathy.  Skin:    General: Skin is warm and dry.  Neurological:     Mental Status: He is alert and oriented to person, place, and time.     Cranial Nerves: No cranial nerve deficit.  Psychiatric:        Behavior: Behavior normal.        Thought Content: Thought content normal.        Judgment: Judgment normal.      LABS: No results found for this or any previous visit (from the past 2160 hour(s)).    Assessment/Plan: 1. Encounter for general adult medical examination with abnormal findings Up to date on PHM.  - CBC with Differential/Platelet - Lipid Panel With LDL/HDL Ratio - TSH - T4, free - Comprehensive metabolic panel - Vitamin D (25 hydroxy) - B12 and Folate Panel - Fe+TIBC+Fer  2. Gastroesophageal reflux disease without esophagitis Stable, continue present management.   3. GAD (generalized anxiety disorder) Stable, continue  - citalopram (CELEXA) 10 MG tablet; Take 1 tablet (10 mg total) by mouth daily.  Dispense: 90 tablet; Refill: 1  4. Vitamin D deficiency Recheck Vit D level. Has completed medications.  5. Dysuria - UA/M w/rflx Culture, Routine  General Counseling: Alfred Gross verbalizes understanding of the findings of todays visit  and agrees with plan of treatment. I have discussed any further diagnostic evaluation that may be needed or ordered today. We also reviewed his medications today. he has been encouraged to call the office with any questions or concerns that should arise related to todays visit.   Orders Placed This Encounter  Procedures  . UA/M w/rflx Culture, Routine    No orders of the defined types were placed in this encounter.   Time spent: 30 Minutes   This patient was seen by Orson Gear AGNP-C in Collaboration with Dr Lavera Guise as a part of collaborative care agreement    Kendell Bane AGNP-C Internal Medicine

## 2019-12-08 LAB — MICROSCOPIC EXAMINATION
Bacteria, UA: NONE SEEN
Casts: NONE SEEN /lpf

## 2019-12-08 LAB — UA/M W/RFLX CULTURE, ROUTINE
Bilirubin, UA: NEGATIVE
Glucose, UA: NEGATIVE
Ketones, UA: NEGATIVE
Leukocytes,UA: NEGATIVE
Nitrite, UA: NEGATIVE
Protein,UA: NEGATIVE
Specific Gravity, UA: 1.026 (ref 1.005–1.030)
Urobilinogen, Ur: 0.2 mg/dL (ref 0.2–1.0)
pH, UA: 5 (ref 5.0–7.5)

## 2019-12-14 ENCOUNTER — Telehealth: Payer: Self-pay | Admitting: Licensed Clinical Social Worker

## 2019-12-14 NOTE — Telephone Encounter (Signed)
Revealed negative genetic testing. This is a true negative result as 2 mutations are known in the family that explain the cancer, and he did not inherit either one.  This normal result is reassuring.

## 2019-12-15 ENCOUNTER — Encounter: Payer: Self-pay | Admitting: Licensed Clinical Social Worker

## 2019-12-15 ENCOUNTER — Ambulatory Visit: Payer: Self-pay | Admitting: Licensed Clinical Social Worker

## 2019-12-15 DIAGNOSIS — Z1379 Encounter for other screening for genetic and chromosomal anomalies: Secondary | ICD-10-CM

## 2019-12-15 DIAGNOSIS — Z8042 Family history of malignant neoplasm of prostate: Secondary | ICD-10-CM

## 2019-12-15 DIAGNOSIS — Z800 Family history of malignant neoplasm of digestive organs: Secondary | ICD-10-CM

## 2019-12-15 DIAGNOSIS — Z806 Family history of leukemia: Secondary | ICD-10-CM

## 2019-12-15 NOTE — Progress Notes (Signed)
HPI:  Alfred Gross was previously seen in the [facility] due to his father's recent genetic testing that revealed a mutation in CHEK2 and a mutation in MSH2 and concerns regarding a hereditary predisposition to cancer. Please refer to our prior cancer genetics clinic note for more information regarding our discussion, assessment and recommendations, at the time. Alfred Gross recent genetic test results were disclosed to him, as were recommendations warranted by these results. These results and recommendations are discussed in more detail below.  CANCER HISTORY:  Oncology History   No history exists.    FAMILY HISTORY:  We obtained a detailed, 4-generation family history.  Significant diagnoses are listed below: Family History  Problem Relation Age of Onset  . Healthy Mother   . Colon cancer Father        MSH2+ Lynch syndrome, CHEK2+  . Colon cancer Paternal Uncle 73  . Colon cancer Paternal Grandfather        "Lynch+"  . Prostate cancer Paternal Grandfather        dx 68s  . Leukemia Half-Brother 16  . Colon cancer Half-Brother 37       "Lynch+"    Alfred Gross has a son, 68, and a daughter, 3. He has 1 paternal half brother, Alfred Gross, and 2 paternal half sisters. Alfred Gross had leukemia at 56 and colon cancer at 22 and has reportedly tested positive for Lynch syndrome. Patient's pat half sister Alfred Gross is working on getting testing.   Mr. Hewes father, Alfred Gross, was tested and found to have an MSH2 pathogenic variant and CHEK2 likely pathogenic variant. Alfred Gross was diagnosed with colon cancer at 43. Patient had 2 paternal uncles. One had colon cancer at 24 and never had testing, he is deceased. His other uncle has not had cancer but has reportedly tested positive for Lynch syndrome. Paternal grandfather was first to have genetic testing and reportedly tested positive for Lynch and had colon and prostate cancer in his 73s. Paternal grandmother died recently at 45 of  dementia.  Alfred Gross mother is living at 32 with no history of cancer. Patient has 1 maternal uncle and 1 maternal cousin, no cancers. Maternal grandmother is living at 66 and limited information about maternal grandfather.  Alfred Gross is aware of previous family history of genetic testing for hereditary cancer risks. Patient's maternal ancestors are of unknown descent, and paternal ancestors are of [place] descent. There is no reported Ashkenazi Jewish ancestry. There is no known consanguinity.  GENETIC TEST RESULTS: Genetic testing reported out on 12/13/2019 through familial variant testing for MSH2 + CHEK2 revealed no pathogenic mutations.   The test report has been scanned into EPIC and is located under the Molecular Pathology section of the Results Review tab.  A portion of the result report is included below for reference.       We recommended Mr. Chiriboga pursue testing for the familial hereditary cancer gene mutations in MSH2 and CHEK2. Mr. Borum test was normal and did not reveal the familial mutations. We call this result a true negative result because the cancer-causing mutations were identified in Alfred Gross family, and he did not inherit it.  Given this negative result, Alfred Gross chances of developing CHEK2 and MSH2-related cancers are the same as they are in the general population.    ADDITIONAL GENETIC TESTING: We discussed with Mr. Mcmanaman that there are other genes that are associated with increased cancer risk that can be analyzed. Should Alfred Gross wish to pursue additional  genetic testing, we are happy to discuss and coordinate this testing, at any time.    CANCER SCREENING RECOMMENDATIONS: Alfred Gross test result is considered negative (normal).  This means that we have not identified the known familial mutations in CHEK2 and MSH2 in him.   While reassuring, this does not definitively rule out a hereditary predisposition to cancer. It is still  possible that there could be genetic mutations that are undetectable by current technology. There could be genetic mutations in genes that have not been tested or identified to increase cancer risk.  Therefore, it is recommended he continue to follow the cancer management and screening guidelines provided by his  primary healthcare provider.   An individual's cancer risk and medical management are not determined by genetic test results alone. Overall cancer risk assessment incorporates additional factors, including personal medical history, family history, and any available genetic information that may result in a personalized plan for cancer prevention and surveillance.  RECOMMENDATIONS FOR FAMILY MEMBERS:  Relatives in this family might be at some increased risk of developing cancer, over the general population risk, simply due to the family history of cancer.  We recommended male relatives in this family have a yearly mammogram beginning at age 41, or 72 years younger than the earliest onset of cancer, an annual clinical breast exam, and perform monthly breast self-exams. Male relatives in this family should also have a gynecological exam as recommended by their primary provider. All family members should have a colonoscopy by age 68, or as directed by their physicians.    Based on Alfred Gross family history, we recommended all paternal relatives have genetic counseling and testing. Alfred Gross will let us know if we can be of any assistance in coordinating genetic counseling and/or testing for these family members.  FOLLOW-UP: Lastly, we discussed with Alfred Gross that cancer genetics is a rapidly advancing field and it is possible that new genetic tests will be appropriate for him and/or his family members in the future. We encouraged him to remain in contact with cancer genetics on an annual basis so we can update his personal and family histories and let him know of advances in cancer  genetics that may benefit this family.   Our contact number was provided. Alfred Gross questions were answered to his satisfaction, and he knows he is welcome to call us at anytime with additional questions or concerns.   Faith Rogue, MS, [facility] Genetic Counselor [email or web address] Phone: [phone]

## 2019-12-31 ENCOUNTER — Encounter: Payer: Self-pay | Admitting: Nurse Practitioner

## 2019-12-31 ENCOUNTER — Ambulatory Visit: Payer: Managed Care, Other (non HMO) | Admitting: Nurse Practitioner

## 2019-12-31 ENCOUNTER — Ambulatory Visit: Payer: 59 | Admitting: Nurse Practitioner

## 2019-12-31 VITALS — Ht 69.0 in | Wt 238.0 lb

## 2019-12-31 DIAGNOSIS — H1032 Unspecified acute conjunctivitis, left eye: Secondary | ICD-10-CM

## 2019-12-31 MED ORDER — POLYMYXIN B-TRIMETHOPRIM 10000-0.1 UNIT/ML-% OP SOLN: 1.0000 [drp] | OPHTHALMIC | 0 refills | Status: DC

## 2019-12-31 NOTE — Progress Notes (Signed)
[facility] [place], [place] [address]  Internal MEDICINE  Telephone Visit  Patient Name: Alfred Gross  [id number]  [id number]  Date of Service: 12/31/2019  I connected with the patient at 12:45pm by webcam and verified the patients identity using two identifiers.   I discussed the limitations, risks, security and privacy concerns of performing an evaluation and management service by webcam and the availability of in person appointments. I also discussed with the patient that there may be a patient responsible charge related to the service.  The patient expressed understanding and agrees to proceed.    Chief Complaint  Patient presents with  . Telephone Assessment  . Telephone Screen  . Conjunctivitis    got something in eye on monday, was able to get it out, since then wake up with left eye being shut, yellowish whiteish pus coming out and burning    The patient has been contacted via webcam for follow up visit due to concerns for spread of novel coronavirus. He presents for acute visit. Today, he c/o left eye irritation and drainage. States this started in Monday after he got something in his eye. States that he wok up Tuesday with watering and draining of the eye. Eye lid glued shut when he gets up in the [place]. This has gotten progressively worse since onset of symptoms.       Current Medication: Outpatient Encounter Medications as of 12/31/2019  Medication Sig  . albuterol (PROVENTIL HFA;VENTOLIN HFA) 108 (90 Base) MCG/ACT inhaler Inhale 2 puffs into the lungs every 6 (six) hours as needed for wheezing or shortness of breath.  . cetirizine (ZYRTEC) 10 MG tablet Take 10 mg by mouth daily.  . citalopram (CELEXA) 10 MG tablet Take 1 tablet (10 mg total) by mouth daily.  . clotrimazole-betamethasone (LOTRISONE) cream Apply 1 application topically 2 (two) times daily. Apply extrernaly bid to feet prn  . fluticasone (FLONASE) 50 MCG/ACT nasal spray  Place 2 sprays into both nostrils 2 (two) times a day.  Marland Kitchen omeprazole (PRILOSEC) 40 MG capsule Take 1 capsule (40 mg total) by mouth daily.  Marland Kitchen triamcinolone (KENALOG) 0.025 % cream Apply 1 application topically 2 (two) times daily.  . Vitamin D, Ergocalciferol, (DRISDOL) 1.25 MG (50000 UT) CAPS capsule Take 1 capsule (50,000 Units total) by mouth every 7 (seven) days.  Marland Kitchen trimethoprim-polymyxin b (POLYTRIM) ophthalmic solution Place 1 drop into both eyes every 2 (two) hours while awake.   No facility-administered encounter medications on file as of 12/31/2019.    Surgical History: No past surgical history on file.  Medical History: Past Medical History:  Diagnosis Date  . Family history of colon cancer   . Family history of leukemia   . Family history of Lynch syndrome   . Family history of prostate cancer     Family History: Family History  Problem Relation Age of Onset  . Healthy Mother   . Colon cancer Father        MSH2+ Lynch syndrome, CHEK2+  . Colon cancer Paternal Uncle 64  . Colon cancer Paternal Grandfather        "Lynch+"  . Prostate cancer Paternal Grandfather        dx 43s  . Leukemia Half-Brother 16  . Colon cancer Half-Brother 4       "Lynch+"    Social History   Socioeconomic History  . Marital status: Married    Spouse name: Not on file  . Number of children: Not on  file  . Years of education: Not on file  . Highest education level: Not on file  Occupational History  . Not on file  Tobacco Use  . Smoking status: Former Smoker    Packs/day: 1.00    Years: 10.00    Pack years: 10.00    Types: Cigarettes    Quit date: 11/28/2016    Years since quitting: 3.0  . Smokeless tobacco: Never Used  Substance and Sexual Activity  . Alcohol use: No  . Drug use: No  . Sexual activity: Not on file  Other Topics Concern  . Not on file  Social History Narrative  . Not on file   Social Determinants of Health   Financial Resource Strain:   . Difficulty of  Paying Living Expenses: Not on file  Food Insecurity:   . Worried About Charity fundraiser in the Last Year: Not on file  . Ran Out of Food in the Last Year: Not on file  Transportation Needs:   . Lack of Transportation (Medical): Not on file  . Lack of Transportation (Non-Medical): Not on file  Physical Activity:   . Days of Exercise per Week: Not on file  . Minutes of Exercise per Session: Not on file  Stress:   . Feeling of Stress : Not on file  Social Connections:   . Frequency of Communication with Friends and Family: Not on file  . Frequency of Social Gatherings with Friends and Family: Not on file  . Attends Religious Services: Not on file  . Active Member of Clubs or Organizations: Not on file  . Attends Archivist Meetings: Not on file  . Marital Status: Not on file  Intimate Partner Violence:   . Fear of Current or Ex-Partner: Not on file  . Emotionally Abused: Not on file  . Physically Abused: Not on file  . Sexually Abused: Not on file      Review of Systems  Constitutional: Negative for activity change, chills, fatigue and unexpected weight change.  HENT: Negative for congestion, postnasal drip, rhinorrhea, sneezing and sore throat.   Eyes: Positive for pain, discharge, redness and itching.       Swelling of the top and bottom lid of the left eye.   Respiratory: Negative for cough, chest tightness, shortness of breath and wheezing.   Cardiovascular: Negative for chest pain and palpitations.  Gastrointestinal: Negative for abdominal pain, constipation, diarrhea, nausea and vomiting.  Musculoskeletal: Negative for arthralgias, back pain, joint swelling and neck pain.  Skin: Negative for rash.  Neurological: Negative for dizziness, tremors, numbness and headaches.  Hematological: Negative for adenopathy. Does not bruise/bleed easily.  Psychiatric/Behavioral: Negative for behavioral problems (Depression), sleep disturbance and suicidal ideas. The patient is  not nervous/anxious.     Today's Vitals   12/31/19 1053  Weight: 238 lb (108 kg)  Height: 5' 9" (1.753 m)   Body mass index is 35.15 kg/m.  Observation/Objective:  The patient is alert and oriented. He is pleasant and answering all questions appropriately. Breathing is non-labored. He is in no acute distress. The left eye appears irritated. The upper and lower lids are both edematous. Conjunctiva is red and irritated appearing.    Assessment/Plan:  1. Acute conjunctivitis of left eye, unspecified acute conjunctivitis type Start polytrim eye drops. Advised him to insert one drop in both eyes every two hours while awake for next 5 to 7 days. He should avoid touching the eyes with his hands as much as possible.  Wash hands frequently. Consider applying warm compress to affected eye to inprove discomfort.  - trimethoprim-polymyxin b (POLYTRIM) ophthalmic solution; Place 1 drop into both eyes every 2 (two) hours while awake.  Dispense: 10 mL; Refill: 0   General Counseling: Trust verbalizes understanding of the findings of today's phone visit and agrees with plan of treatment. I have discussed any further diagnostic evaluation that may be needed or ordered today. We also reviewed his medications today. he has been encouraged to call the office with any questions or concerns that should arise related to todays visit.  This patient was seen by [place] with Dr Lavera Guise as a part of collaborative care agreement  Meds ordered this encounter  Medications  . trimethoprim-polymyxin b (POLYTRIM) ophthalmic solution    Sig: Place 1 drop into both eyes every 2 (two) hours while awake.    Dispense:  10 mL    Refill:  0    Order Specific Question:   Supervising Provider    Answer:   Lavera Guise [[id number]]    Time spent: 60 Minutes    Dr Lavera Guise Internal medicine

## 2020-01-16 LAB — COMPREHENSIVE METABOLIC PANEL
ALT: 14 IU/L (ref 0–44)
AST: 14 IU/L (ref 0–40)
Albumin/Globulin Ratio: 1.6 (ref 1.2–2.2)
Albumin: 4.6 g/dL (ref 4.1–5.2)
Alkaline Phosphatase: 80 IU/L (ref 39–117)
BUN/Creatinine Ratio: 16 (ref 9–20)
BUN: 14 mg/dL (ref 6–20)
Bilirubin Total: 0.4 mg/dL (ref 0.0–1.2)
CO2: 24 mmol/L (ref 20–29)
Calcium: 9.8 mg/dL (ref 8.7–10.2)
Chloride: 106 mmol/L (ref 96–106)
Creatinine, Ser: 0.9 mg/dL (ref 0.76–1.27)
GFR calc Af Amer: 132 mL/min/{1.73_m2} (ref >59)
GFR calc non Af Amer: 114 mL/min/{1.73_m2} (ref >59)
Globulin, Total: 2.9 g/dL (ref 1.5–4.5)
Glucose: 111 mg/dL — ABNORMAL HIGH (ref 65–99)
Potassium: 5 mmol/L (ref 3.5–5.2)
Sodium: 142 mmol/L (ref 134–144)
Total Protein: 7.5 g/dL (ref 6.0–8.5)

## 2020-01-16 LAB — CBC WITH DIFFERENTIAL/PLATELET
Basophils Absolute: 0.1 10*3/uL (ref 0.0–0.2)
Basos: 1 %
EOS (ABSOLUTE): 0.5 10*3/uL — ABNORMAL HIGH (ref 0.0–0.4)
Eos: 6 %
Hematocrit: 46.1 % (ref 37.5–51.0)
Hemoglobin: 16.1 g/dL (ref 13.0–17.7)
Immature Grans (Abs): 0 10*3/uL (ref 0.0–0.1)
Immature Granulocytes: 0 %
Lymphocytes Absolute: 2.3 10*3/uL (ref 0.7–3.1)
Lymphs: 27 %
MCH: 29.3 pg (ref 26.6–33.0)
MCHC: 34.9 g/dL (ref 31.5–35.7)
MCV: 84 fL (ref 79–97)
Monocytes Absolute: 0.8 10*3/uL (ref 0.1–0.9)
Monocytes: 9 %
Neutrophils Absolute: 4.8 10*3/uL (ref 1.4–7.0)
Neutrophils: 57 %
Platelets: 262 10*3/uL (ref 150–450)
RBC: 5.49 x10E6/uL (ref 4.14–5.80)
RDW: 12.1 % (ref 11.6–15.4)
WBC: 8.3 10*3/uL (ref 3.4–10.8)

## 2020-01-16 LAB — IRON,TIBC AND FERRITIN PANEL
Ferritin: 278 ng/mL (ref 30–400)
Iron Saturation: 33 % (ref 15–55)
Iron: 85 ug/dL (ref 38–169)
Total Iron Binding Capacity: 254 ug/dL (ref 250–450)
UIBC: 169 ug/dL (ref 111–343)

## 2020-01-16 LAB — TSH: TSH: 2.3 u[IU]/mL (ref 0.450–4.500)

## 2020-01-16 LAB — VITAMIN D 25 HYDROXY (VIT D DEFICIENCY, FRACTURES): Vit D, 25-Hydroxy: 23.6 ng/mL — ABNORMAL LOW (ref 30.0–100.0)

## 2020-01-16 LAB — LIPID PANEL WITH LDL/HDL RATIO
Cholesterol, Total: 188 mg/dL (ref 100–199)
HDL: 33 mg/dL — ABNORMAL LOW (ref >39)
LDL Chol Calc (NIH): 115 mg/dL — ABNORMAL HIGH (ref 0–99)
LDL/HDL Ratio: 3.5 ratio (ref 0.0–3.6)
Triglycerides: 226 mg/dL — ABNORMAL HIGH (ref 0–149)
VLDL Cholesterol Cal: 40 mg/dL (ref 5–40)

## 2020-01-16 LAB — B12 AND FOLATE PANEL
Folate: 6.3 ng/mL (ref >3.0)
Vitamin B-12: 305 pg/mL (ref 232–1245)

## 2020-01-16 LAB — T4, FREE: Free T4: 1.02 ng/dL (ref 0.82–1.77)

## 2020-01-20 ENCOUNTER — Telehealth: Payer: Self-pay

## 2020-01-20 NOTE — Telephone Encounter (Signed)
CONFIRMED 01-22-20 OV AS VIRTUAL AT PATIENT'S REQ SO THEY DID NOT HAVE TO MISS WORK. ADVISED NEXT FU WOULD BE IN OFFICE.

## 2020-01-22 ENCOUNTER — Other Ambulatory Visit: Payer: Self-pay

## 2020-01-22 ENCOUNTER — Ambulatory Visit (INDEPENDENT_AMBULATORY_CARE_PROVIDER_SITE_OTHER): Payer: 59 | Admitting: Nurse Practitioner

## 2020-01-22 ENCOUNTER — Encounter: Payer: Self-pay | Admitting: Nurse Practitioner

## 2020-01-22 VITALS — Ht 69.0 in | Wt 235.0 lb

## 2020-01-22 DIAGNOSIS — E782 Mixed hyperlipidemia: Secondary | ICD-10-CM

## 2020-01-22 DIAGNOSIS — E559 Vitamin D deficiency, unspecified: Secondary | ICD-10-CM | POA: Diagnosis not present

## 2020-01-22 DIAGNOSIS — R5383 Other fatigue: Secondary | ICD-10-CM | POA: Diagnosis not present

## 2020-01-22 MED ORDER — VITAMIN D (ERGOCALCIFEROL) 1.25 MG (50000 UNIT) PO CAPS: 50000.0000 [IU] | ORAL_CAPSULE | ORAL | 5 refills | Status: DC

## 2020-01-22 NOTE — Progress Notes (Signed)
[facility] [place],  [address]  Internal MEDICINE  Telephone Visit  Patient Name: Alfred Gross  [id number]  [id number]  Date of Service: 02/06/2020  I connected with the patient at 4:39pm by webcam and verified the patients identity using two identifiers.   I discussed the limitations, risks, security and privacy concerns of performing an evaluation and management service by webcam and the availability of in person appointments. I also discussed with the patient that there may be a patient responsible charge related to the service.  The patient expressed understanding and agrees to proceed.    Chief Complaint  Patient presents with  . Telephone Assessment  . Telephone Screen  . Gastroesophageal Reflux    The patient has been contacted via webcam for follow up visit due to concerns for spread of novel coronavirus. The patient presents for routine follow up visit. Complaining of fatigue. Recently had routine, fasting labs done. Patient has vitamin d deficiency. He also has mild generalized elevation of lipid panel. He states that he is otherwise doing well.       Current Medication: Outpatient Encounter Medications as of 01/22/2020  Medication Sig  . albuterol (PROVENTIL HFA;VENTOLIN HFA) 108 (90 Base) MCG/ACT inhaler Inhale 2 puffs into the lungs every 6 (six) hours as needed for wheezing or shortness of breath.  . cetirizine (ZYRTEC) 10 MG tablet Take 10 mg by mouth daily.  . citalopram (CELEXA) 10 MG tablet Take 1 tablet (10 mg total) by mouth daily.  . clotrimazole-betamethasone (LOTRISONE) cream Apply 1 application topically 2 (two) times daily. Apply extrernaly bid to feet prn  . fluticasone (FLONASE) 50 MCG/ACT nasal spray Place 2 sprays into both nostrils 2 (two) times a day.  Marland Kitchen omeprazole (PRILOSEC) 40 MG capsule Take 1 capsule (40 mg total) by mouth daily.  Marland Kitchen triamcinolone (KENALOG) 0.025 % cream Apply 1 application topically 2 (two)  times daily.  Marland Kitchen trimethoprim-polymyxin b (POLYTRIM) ophthalmic solution Place 1 drop into both eyes every 2 (two) hours while awake.  . Vitamin D, Ergocalciferol, (DRISDOL) 1.25 MG (50000 UNIT) CAPS capsule Take 1 capsule (50,000 Units total) by mouth every 7 (seven) days.  . [DISCONTINUED] Vitamin D, Ergocalciferol, (DRISDOL) 1.25 MG (50000 UT) CAPS capsule Take 1 capsule (50,000 Units total) by mouth every 7 (seven) days.   No facility-administered encounter medications on file as of 01/22/2020.    Surgical History: History reviewed. No pertinent surgical history.  Medical History: Past Medical History:  Diagnosis Date  . Family history of colon cancer   . Family history of leukemia   . Family history of Lynch syndrome   . Family history of prostate cancer     Family History: Family History  Problem Relation Age of Onset  . Healthy Mother   . Colon cancer Father        MSH2+ Lynch syndrome, CHEK2+  . Colon cancer Paternal Uncle 74  . Colon cancer Paternal Grandfather        "Lynch+"  . Prostate cancer Paternal Grandfather        dx 50s  . Leukemia Half-Brother 16  . Colon cancer Half-Brother 39       "Lynch+"    Social History   Socioeconomic History  . Marital status: Married    Spouse name: Not on file  . Number of children: Not on file  . Years of education: Not on file  . Highest education level: Not on file  Occupational History  . Not on  file  Tobacco Use  . Smoking status: Former Smoker    Packs/day: 1.00    Years: 10.00    Pack years: 10.00    Types: Cigarettes    Quit date: 11/28/2016    Years since quitting: 3.1  . Smokeless tobacco: Never Used  Substance and Sexual Activity  . Alcohol use: No  . Drug use: No  . Sexual activity: Not on file  Other Topics Concern  . Not on file  Social History Narrative  . Not on file   Social Determinants of Health   Financial Resource Strain:   . Difficulty of Paying Living Expenses:   Food Insecurity:    . Worried About Charity fundraiser in the Last Year:   . Arboriculturist in the Last Year:   Transportation Needs:   . Film/video editor (Medical):   Marland Kitchen Lack of Transportation (Non-Medical):   Physical Activity:   . Days of Exercise per Week:   . Minutes of Exercise per Session:   Stress:   . Feeling of Stress :   Social Connections:   . Frequency of Communication with Friends and Family:   . Frequency of Social Gatherings with Friends and Family:   . Attends Religious Services:   . Active Member of Clubs or Organizations:   . Attends Archivist Meetings:   Marland Kitchen Marital Status:   Intimate Partner Violence:   . Fear of Current or Ex-Partner:   . Emotionally Abused:   Marland Kitchen Physically Abused:   . Sexually Abused:       Review of Systems  Constitutional: Positive for fatigue. Negative for chills and unexpected weight change.  HENT: Negative for congestion, rhinorrhea, sneezing and sore throat.   Respiratory: Negative for cough, chest tightness, shortness of breath and wheezing.   Cardiovascular: Negative for chest pain and palpitations.  Gastrointestinal: Negative for abdominal pain, constipation, diarrhea, nausea and vomiting.       History of GERD.   Endocrine: Negative for cold intolerance, heat intolerance, polydipsia and polyuria.  Musculoskeletal: Negative for arthralgias, back pain, joint swelling and neck pain.  Skin: Negative for rash.  Allergic/Immunologic: Positive for environmental allergies.  Neurological: Negative for dizziness, tremors, numbness and headaches.  Hematological: Negative for adenopathy. Does not bruise/bleed easily.  Psychiatric/Behavioral: Positive for dysphoric mood. Negative for behavioral problems, sleep disturbance and suicidal ideas. The patient is nervous/anxious.        Well managed on current medication.    Today's Vitals   01/22/20 1601  Weight: 235 lb (106.6 kg)  Height: 5' 9"  (1.753 m)   Body mass index is 34.7  kg/m.  Observation/Objective:  The patient is alert and oriented. He is pleasant and answering all questions appropriately. Breathing is non-labored. He is in no acute distress.    Assessment/Plan: 1. Mixed hyperlipidemia Reviewed labs with patient showing mild, generalized elevation of lipid panel. Discussed diet and lifestyle changes needed to lower lippid panel without starting medications.   2. Vitamin D deficiency Drisdol 50000iu weekly for next 6 months. - Vitamin D, Ergocalciferol, (DRISDOL) 1.25 MG (50000 UNIT) CAPS capsule; Take 1 capsule (50,000 Units total) by mouth every 7 (seven) days.  Dispense: 5 capsule; Refill: 5  3. Other fatigue Labs indicate vitamin d deficiency. Other labs good. Will continue to monitor.   General Counseling: edrees valent understanding of the findings of today's phone visit and agrees with plan of treatment. I have discussed any further diagnostic evaluation that may be needed  or ordered today. We also reviewed his medications today. he has been encouraged to call the office with any questions or concerns that should arise related to todays visit.   This patient was seen by [place] with Dr Lavera Guise as a part of collaborative care agreement  Meds ordered this encounter  Medications  . Vitamin D, Ergocalciferol, (DRISDOL) 1.25 MG (50000 UNIT) CAPS capsule    Sig: Take 1 capsule (50,000 Units total) by mouth every 7 (seven) days.    Dispense:  5 capsule    Refill:  5    Order Specific Question:   Supervising Provider    Answer:   Lavera Guise [[id number]]    Time spent: 61 Minutes    Dr Lavera Guise Internal medicine

## 2020-02-06 DIAGNOSIS — E782 Mixed hyperlipidemia: Secondary | ICD-10-CM | POA: Insufficient documentation

## 2020-02-06 DIAGNOSIS — R5383 Other fatigue: Secondary | ICD-10-CM | POA: Insufficient documentation

## 2020-02-16 ENCOUNTER — Ambulatory Visit: Payer: 59 | Admitting: Adult Health

## 2020-02-16 ENCOUNTER — Encounter: Payer: Self-pay | Admitting: Adult Health

## 2020-02-16 VITALS — Temp 98.3°F | Ht 69.0 in | Wt 235.0 lb

## 2020-02-16 DIAGNOSIS — J301 Allergic rhinitis due to pollen: Secondary | ICD-10-CM

## 2020-02-16 DIAGNOSIS — R059 Cough, unspecified: Secondary | ICD-10-CM

## 2020-02-16 DIAGNOSIS — J0110 Acute frontal sinusitis, unspecified: Secondary | ICD-10-CM

## 2020-02-16 DIAGNOSIS — R05 Cough: Secondary | ICD-10-CM

## 2020-02-16 MED ORDER — SULFAMETHOXAZOLE-TRIMETHOPRIM 400-80 MG PO TABS: 1.0000 | ORAL_TABLET | Freq: Two times a day (BID) | ORAL | 0 refills | Status: DC

## 2020-02-16 NOTE — Progress Notes (Signed)
[facility] [place], [place] [address]  Internal MEDICINE  Telephone Visit  Patient Name: Alfred Gross  [id number]  [id number]  Date of Service: 02/16/2020  I connected with the patient at 852 by video and verified the patients identity using two identifiers.   I discussed the limitations, risks, security and privacy concerns of performing an evaluation and management service by telephone and the availability of in person appointments. I also discussed with the patient that there may be a patient responsible charge related to the service.  The patient expressed understanding and agrees to proceed.    Chief Complaint  Patient presents with  . Telephone Assessment  . Telephone Screen  . Sinusitis  . Headache    HPI  PT seen via video. Pt reports 5 days of sinus pain/pressure, headache and runny nose.  He is taking his allergy medication and using flonase nasal spray daily.  He does have some mild intermittent coughing, he reports mostly in the morning from drainage. He denies fever or chills.    Current Medication: Outpatient Encounter Medications as of 02/16/2020  Medication Sig  . albuterol (PROVENTIL HFA;VENTOLIN HFA) 108 (90 Base) MCG/ACT inhaler Inhale 2 puffs into the lungs every 6 (six) hours as needed for wheezing or shortness of breath.  . cetirizine (ZYRTEC) 10 MG tablet Take 10 mg by mouth daily.  . citalopram (CELEXA) 10 MG tablet Take 1 tablet (10 mg total) by mouth daily.  . clotrimazole-betamethasone (LOTRISONE) cream Apply 1 application topically 2 (two) times daily. Apply extrernaly bid to feet prn  . fluticasone (FLONASE) 50 MCG/ACT nasal spray Place 2 sprays into both nostrils 2 (two) times a day.  Marland Kitchen omeprazole (PRILOSEC) 40 MG capsule Take 1 capsule (40 mg total) by mouth daily.  Marland Kitchen triamcinolone (KENALOG) 0.025 % cream Apply 1 application topically 2 (two) times daily.  Marland Kitchen trimethoprim-polymyxin b (POLYTRIM) ophthalmic solution  Place 1 drop into both eyes every 2 (two) hours while awake.  . Vitamin D, Ergocalciferol, (DRISDOL) 1.25 MG (50000 UNIT) CAPS capsule Take 1 capsule (50,000 Units total) by mouth every 7 (seven) days.  Marland Kitchen sulfamethoxazole-trimethoprim (BACTRIM) 400-80 MG tablet Take 1 tablet by mouth 2 (two) times daily.   No facility-administered encounter medications on file as of 02/16/2020.    Surgical History: History reviewed. No pertinent surgical history.  Medical History: Past Medical History:  Diagnosis Date  . Family history of colon cancer   . Family history of leukemia   . Family history of Lynch syndrome   . Family history of prostate cancer     Family History: Family History  Problem Relation Age of Onset  . Healthy Mother   . Colon cancer Father        MSH2+ Lynch syndrome, CHEK2+  . Colon cancer Paternal Uncle 65  . Colon cancer Paternal Grandfather        "Lynch+"  . Prostate cancer Paternal Grandfather        dx 19s  . Leukemia Half-Brother 16  . Colon cancer Half-Brother 31       "Lynch+"    Social History   Socioeconomic History  . Marital status: Married    Spouse name: Not on file  . Number of children: Not on file  . Years of education: Not on file  . Highest education level: Not on file  Occupational History  . Not on file  Tobacco Use  . Smoking status: Former Smoker    Packs/day: 1.00  Years: 10.00    Pack years: 10.00    Types: Cigarettes    Quit date: 11/28/2016    Years since quitting: 3.2  . Smokeless tobacco: Never Used  Substance and Sexual Activity  . Alcohol use: No  . Drug use: No  . Sexual activity: Not on file  Other Topics Concern  . Not on file  Social History Narrative  . Not on file   Social Determinants of Health   Financial Resource Strain:   . Difficulty of Paying Living Expenses:   Food Insecurity:   . Worried About Charity fundraiser in the Last Year:   . Arboriculturist in the Last Year:   Transportation Needs:   .  Film/video editor (Medical):   Marland Kitchen Lack of Transportation (Non-Medical):   Physical Activity:   . Days of Exercise per Week:   . Minutes of Exercise per Session:   Stress:   . Feeling of Stress :   Social Connections:   . Frequency of Communication with Friends and Family:   . Frequency of Social Gatherings with Friends and Family:   . Attends Religious Services:   . Active Member of Clubs or Organizations:   . Attends Archivist Meetings:   Marland Kitchen Marital Status:   Intimate Partner Violence:   . Fear of Current or Ex-Partner:   . Emotionally Abused:   Marland Kitchen Physically Abused:   . Sexually Abused:       Review of Systems  Constitutional: Negative.  Negative for chills, fatigue, fever and unexpected weight change.  HENT: Positive for postnasal drip, sinus pressure and sinus pain. Negative for congestion, rhinorrhea, sneezing and sore throat.   Eyes: Negative for redness.  Respiratory: Negative.  Negative for cough, chest tightness and shortness of breath.   Cardiovascular: Negative.  Negative for chest pain and palpitations.  Gastrointestinal: Negative.  Negative for abdominal pain, constipation, diarrhea, nausea and vomiting.  Endocrine: Negative.   Genitourinary: Negative.  Negative for dysuria and frequency.  Musculoskeletal: Negative.  Negative for arthralgias, back pain, joint swelling and neck pain.  Skin: Negative.  Negative for rash.  Allergic/Immunologic: Negative.   Neurological: Negative.  Negative for tremors and numbness.  Hematological: Negative for adenopathy. Does not bruise/bleed easily.  Psychiatric/Behavioral: Negative.  Negative for behavioral problems, sleep disturbance and suicidal ideas. The patient is not nervous/anxious.     Vital Signs: Temp 98.3 F (36.8 C)   Ht 5' 9"  (1.753 m)   Wt 235 lb (106.6 kg)   BMI 34.70 kg/m    Observation/Objective:  Well appearing, NAD noted.    Assessment/Plan: 1. Subacute frontal sinusitis Advised  patient to take entire course of antibiotics as prescribed with food. Pt should return to clinic in 7-10 days if symptoms fail to improve or new symptoms develop.  - sulfamethoxazole-trimethoprim (BACTRIM) 400-80 MG tablet; Take 1 tablet by mouth 2 (two) times daily.  Dispense: 20 tablet; Refill: 0  2. Cough Use OTC cough medicine as directed.   3. Allergic rhinitis due to pollen, unspecified seasonality Continue flonase and zyrtec as prescribed.   General Counseling: dandy lazaro understanding of the findings of today's phone visit and agrees with plan of treatment. I have discussed any further diagnostic evaluation that may be needed or ordered today. We also reviewed his medications today. he has been encouraged to call the office with any questions or concerns that should arise related to todays visit.    No orders of the defined types  were placed in this encounter.   Meds ordered this encounter  Medications  . sulfamethoxazole-trimethoprim (BACTRIM) 400-80 MG tablet    Sig: Take 1 tablet by mouth 2 (two) times daily.    Dispense:  20 tablet    Refill:  0    Time spent:20 Minutes    Orson Gear AGNP-C Internal medicine

## 2020-06-03 ENCOUNTER — Other Ambulatory Visit: Payer: Self-pay

## 2020-06-03 ENCOUNTER — Encounter: Payer: Self-pay | Admitting: Adult Health

## 2020-06-03 ENCOUNTER — Ambulatory Visit: Payer: 59 | Admitting: Adult Health

## 2020-06-03 VITALS — BP 133/84 | HR 78 | Temp 98.0°F | Resp 16 | Ht 69.0 in | Wt 242.4 lb

## 2020-06-03 DIAGNOSIS — H6500 Acute serous otitis media, unspecified ear: Secondary | ICD-10-CM

## 2020-06-03 DIAGNOSIS — J301 Allergic rhinitis due to pollen: Secondary | ICD-10-CM

## 2020-06-03 MED ORDER — AZITHROMYCIN 250 MG PO TABS: ORAL_TABLET | ORAL | 0 refills | Status: DC

## 2020-06-03 NOTE — Progress Notes (Signed)
[facility] [address] [place], [place] [address]  Internal MEDICINE  Office Visit Note  Patient Name: Alfred Gross  [id number]  [id number]  Date of Service: 06/03/2020  Chief Complaint  Patient presents with  . Acute Visit    right ear infection     HPI Pt is here for a sick visit.  Pt reports 3 weeks of right ear fullness, and pain.  He also states that his hearing has decreased drastically. He denies any fever or drainage. He does report some headaches recently as well.  He had 4 sets of tubes in his ears as a kid.       Current Medication:  Outpatient Encounter Medications as of 06/03/2020  Medication Sig  . albuterol (PROVENTIL HFA;VENTOLIN HFA) 108 (90 Base) MCG/ACT inhaler Inhale 2 puffs into the lungs every 6 (six) hours as needed for wheezing or shortness of breath.  . cetirizine (ZYRTEC) 10 MG tablet Take 10 mg by mouth daily.  . citalopram (CELEXA) 10 MG tablet Take 1 tablet (10 mg total) by mouth daily.  . fluticasone (FLONASE) 50 MCG/ACT nasal spray Place 2 sprays into both nostrils 2 (two) times a day.  Marland Kitchen omeprazole (PRILOSEC) 40 MG capsule Take 1 capsule (40 mg total) by mouth daily.  . Vitamin D, Ergocalciferol, (DRISDOL) 1.25 MG (50000 UNIT) CAPS capsule Take 1 capsule (50,000 Units total) by mouth every 7 (seven) days.  . [DISCONTINUED] triamcinolone (KENALOG) 0.025 % cream Apply 1 application topically 2 (two) times daily.  . [DISCONTINUED] trimethoprim-polymyxin b (POLYTRIM) ophthalmic solution Place 1 drop into both eyes every 2 (two) hours while awake.  Marland Kitchen azithromycin (ZITHROMAX) 250 MG tablet Take as directed  . sulfamethoxazole-trimethoprim (BACTRIM) 400-80 MG tablet Take 1 tablet by mouth 2 (two) times daily. (Patient not taking: Reported on 06/03/2020)  . [DISCONTINUED] clotrimazole-betamethasone (LOTRISONE) cream Apply 1 application topically 2 (two) times daily. Apply extrernaly bid to feet prn   No facility-administered encounter  medications on file as of 06/03/2020.      Medical History: Past Medical History:  Diagnosis Date  . Family history of colon cancer   . Family history of leukemia   . Family history of Lynch syndrome   . Family history of prostate cancer      Vital Signs: BP 133/84   Pulse 78   Temp 98 F (36.7 C)   Resp 16   Ht 5\' 9"  (1.753 m)   Wt 242 lb 6.4 oz (110 kg)   SpO2 96%   BMI 35.80 kg/m    Review of Systems  Constitutional: Negative.  Negative for chills, fatigue and unexpected weight change.  HENT: Negative.  Negative for congestion, rhinorrhea, sneezing and sore throat.   Eyes: Negative for redness.  Respiratory: Negative.  Negative for cough, chest tightness and shortness of breath.   Cardiovascular: Negative.  Negative for chest pain and palpitations.  Gastrointestinal: Negative.  Negative for abdominal pain, constipation, diarrhea, nausea and vomiting.  Endocrine: Negative.   Genitourinary: Negative.  Negative for dysuria and frequency.  Musculoskeletal: Negative.  Negative for arthralgias, back pain, joint swelling and neck pain.  Skin: Negative.  Negative for rash.  Allergic/Immunologic: Negative.   Neurological: Negative.  Negative for tremors and numbness.  Hematological: Negative for adenopathy. Does not bruise/bleed easily.  Psychiatric/Behavioral: Negative.  Negative for behavioral problems, sleep disturbance and suicidal ideas. The patient is not nervous/anxious.     Physical Exam Vitals and nursing note reviewed.  Constitutional:  General: He is not in acute distress.    Appearance: He is well-developed. He is not diaphoretic.  HENT:     Head: Normocephalic and atraumatic.     Mouth/Throat:     Pharynx: No oropharyngeal exudate.  Eyes:     Pupils: Pupils are equal, round, and reactive to light.  Neck:     Thyroid: No thyromegaly.     Vascular: No JVD.     Trachea: No tracheal deviation.  Cardiovascular:     Rate and Rhythm: Normal rate and  regular rhythm.     Heart sounds: Normal heart sounds. No murmur heard.  No friction rub. No gallop.   Pulmonary:     Effort: Pulmonary effort is normal. No respiratory distress.     Breath sounds: Normal breath sounds. No wheezing or rales.  Chest:     Chest wall: No tenderness.  Abdominal:     Palpations: Abdomen is soft.     Tenderness: There is no abdominal tenderness. There is no guarding.  Musculoskeletal:        General: Normal range of motion.     Cervical back: Normal range of motion and neck supple.  Lymphadenopathy:     Cervical: No cervical adenopathy.  Skin:    General: Skin is warm and dry.  Neurological:     Mental Status: He is alert and oriented to person, place, and time.     Cranial Nerves: No cranial nerve deficit.  Psychiatric:        Behavior: Behavior normal.        Thought Content: Thought content normal.        Judgment: Judgment normal.    Assessment/Plan: 1. Acute serous otitis media, recurrence not specified, unspecified laterality Advised patient to take entire course of antibiotics as prescribed with food. Pt should return to clinic in 7-10 days if symptoms fail to improve or new symptoms develop.  - azithromycin (ZITHROMAX) 250 MG tablet; Take as directed  Dispense: 6 each; Refill: 0  2. Allergic rhinitis due to pollen, unspecified seasonality Continue zyrtec and Flonase as prescribed.   General Counseling: afton mikelson understanding of the findings of todays visit and agrees with plan of treatment. I have discussed any further diagnostic evaluation that may be needed or ordered today. We also reviewed his medications today. he has been encouraged to call the office with any questions or concerns that should arise related to todays visit.   No orders of the defined types were placed in this encounter.   Meds ordered this encounter  Medications  . azithromycin (ZITHROMAX) 250 MG tablet    Sig: Take as directed    Dispense:  6 each     Refill:  0    Time spent: 25 Minutes  This patient was seen by Johnna Acosta  AGNP-C in Collaboration with Dr Lyndon Code as a part of collaborative care agreement.  Johnna Acosta AGNP-C Internal Medicine

## 2020-06-13 ENCOUNTER — Other Ambulatory Visit: Payer: Self-pay

## 2020-06-13 DIAGNOSIS — J0110 Acute frontal sinusitis, unspecified: Secondary | ICD-10-CM

## 2020-06-13 MED ORDER — SULFAMETHOXAZOLE-TRIMETHOPRIM 400-80 MG PO TABS: 1.0000 | ORAL_TABLET | Freq: Two times a day (BID) | ORAL | 0 refills | Status: AC

## 2020-06-13 NOTE — Telephone Encounter (Signed)
Left pt message that I sent bactrim to pharmacy and that he should take medication with food.

## 2020-07-26 ENCOUNTER — Ambulatory Visit: Payer: Managed Care, Other (non HMO) | Admitting: Nurse Practitioner

## 2020-08-12 ENCOUNTER — Ambulatory Visit: Payer: 59 | Admitting: Nurse Practitioner

## 2020-08-12 ENCOUNTER — Encounter: Payer: Self-pay | Admitting: Nurse Practitioner

## 2020-08-12 ENCOUNTER — Other Ambulatory Visit: Payer: Self-pay

## 2020-08-12 VITALS — BP 100/58 | HR 72 | Temp 97.4°F | Resp 16 | Ht 69.0 in | Wt 234.6 lb

## 2020-08-12 DIAGNOSIS — K219 Gastro-esophageal reflux disease without esophagitis: Secondary | ICD-10-CM | POA: Diagnosis not present

## 2020-08-12 DIAGNOSIS — H6500 Acute serous otitis media, unspecified ear: Secondary | ICD-10-CM | POA: Diagnosis not present

## 2020-08-12 MED ORDER — PANTOPRAZOLE SODIUM 40 MG PO TBEC: 40.0000 mg | DELAYED_RELEASE_TABLET | Freq: Every day | ORAL | 3 refills | Status: DC

## 2020-08-12 MED ORDER — NEOMYCIN-POLYMYXIN-HC 3.5-10000-1 OP SUSP: OPHTHALMIC | 0 refills | Status: DC

## 2020-08-12 NOTE — Progress Notes (Signed)
[facility] [place], [place] [address]  Internal MEDICINE  Office Visit Note  Patient Name: Alfred Gross  [id number]  [id number]  Date of Service: 08/31/2020  Chief Complaint  Patient presents with  . Follow-up    refill request, trouble with right ear, muffled hearing, if pt scratches right ear his right eyeball moves  . policy update form    received    The patient is here for routine follow up visit. Continues to have bilateral ear pain. Has had chronic otitis and sinus infections. Has been treated multiple times over the past year for ENT infections. Was last treated in august, 2021 with z-pack and nasal spray. He states that symptoms resolved for short period of time, but have returned at this time. He states that this generally happens during the change in seasons and weather gets cold. He has not seen ENT provider since he was younger.  He also states that he continues to have problems with acid reflux. States that insurance will not cover his omeprazole and it is getting expensive to pay for out of pocket. Without the medication he does have severe GERD symptoms.       Current Medication: Outpatient Encounter Medications as of 08/12/2020  Medication Sig Note  . albuterol (PROVENTIL HFA;VENTOLIN HFA) 108 (90 Base) MCG/ACT inhaler Inhale 2 puffs into the lungs every 6 (six) hours as needed for wheezing or shortness of breath.   . cetirizine (ZYRTEC) 10 MG tablet Take 10 mg by mouth daily.   . citalopram (CELEXA) 10 MG tablet Take 1 tablet (10 mg total) by mouth daily.   . fluticasone (FLONASE) 50 MCG/ACT nasal spray Place 2 sprays into both nostrils 2 (two) times a day.   . [DISCONTINUED] azithromycin (ZITHROMAX) 250 MG tablet Take as directed   . neomycin-polymyxin-hydrocortisone (CORTISPORIN) 3.5-10000-1 ophthalmic suspension Insert 4 drops in both ears twice daily for 7 days   . Vitamin D, Ergocalciferol, (DRISDOL) 1.25 MG (50000 UNIT) CAPS  capsule Take 1 capsule (50,000 Units total) by mouth every 7 (seven) days.   . [DISCONTINUED] omeprazole (PRILOSEC) 40 MG capsule Take 1 capsule (40 mg total) by mouth daily. (Patient not taking: Reported on 08/12/2020) 08/12/2020: trial of pantoprazole   . [DISCONTINUED] pantoprazole (PROTONIX) 40 MG tablet Take 1 tablet (40 mg total) by mouth daily.    No facility-administered encounter medications on file as of 08/12/2020.    Surgical History: History reviewed. No pertinent surgical history.  Medical History: Past Medical History:  Diagnosis Date  . Family history of colon cancer   . Family history of leukemia   . Family history of Lynch syndrome   . Family history of prostate cancer     Family History: Family History  Problem Relation Age of Onset  . Healthy Mother   . Colon cancer Father        MSH2+ Lynch syndrome, CHEK2+  . Colon cancer Paternal Uncle 80  . Colon cancer Paternal Grandfather        "Lynch+"  . Prostate cancer Paternal Grandfather        dx 65s  . Leukemia Half-Brother 16  . Colon cancer Half-Brother 22       "Lynch+"    Social History   Socioeconomic History  . Marital status: Married    Spouse name: Not on file  . Number of children: Not on file  . Years of education: Not on file  . Highest education level: Not on file  Occupational  History  . Not on file  Tobacco Use  . Smoking status: Former Smoker    Packs/day: 1.00    Years: 10.00    Pack years: 10.00    Types: Cigarettes    Quit date: 11/28/2016    Years since quitting: 3.7  . Smokeless tobacco: Never Used  Vaping Use  . Vaping Use: Never used  Substance and Sexual Activity  . Alcohol use: No  . Drug use: No  . Sexual activity: Not on file  Other Topics Concern  . Not on file  Social History Narrative  . Not on file   Social Determinants of Health   Financial Resource Strain:   . Difficulty of Paying Living Expenses: Not on file  Food Insecurity:   . Worried About  Charity fundraiser in the Last Year: Not on file  . Ran Out of Food in the Last Year: Not on file  Transportation Needs:   . Lack of Transportation (Medical): Not on file  . Lack of Transportation (Non-Medical): Not on file  Physical Activity:   . Days of Exercise per Week: Not on file  . Minutes of Exercise per Session: Not on file  Stress:   . Feeling of Stress : Not on file  Social Connections:   . Frequency of Communication with Friends and Family: Not on file  . Frequency of Social Gatherings with Friends and Family: Not on file  . Attends Religious Services: Not on file  . Active Member of Clubs or Organizations: Not on file  . Attends Archivist Meetings: Not on file  . Marital Status: Not on file  Intimate Partner Violence:   . Fear of Current or Ex-Partner: Not on file  . Emotionally Abused: Not on file  . Physically Abused: Not on file  . Sexually Abused: Not on file      Review of Systems  Constitutional: Negative for activity change, chills, fatigue and unexpected weight change.  HENT: Positive for ear pain, sinus pressure and sinus pain. Negative for congestion, postnasal drip, rhinorrhea, sneezing and sore throat.   Respiratory: Negative for cough, chest tightness, shortness of breath and wheezing.   Cardiovascular: Negative for chest pain and palpitations.  Gastrointestinal: Negative for abdominal pain, constipation, diarrhea, nausea and vomiting.       Severe acid reflux if he does not take PPI  Endocrine: Negative for cold intolerance, heat intolerance, polydipsia and polyuria.  Musculoskeletal: Negative for arthralgias, back pain, joint swelling and neck pain.  Skin: Negative for rash.  Allergic/Immunologic: Positive for environmental allergies.  Neurological: Positive for headaches. Negative for dizziness, tremors and numbness.  Hematological: Negative for adenopathy. Does not bruise/bleed easily.  Psychiatric/Behavioral: Negative for behavioral  problems (Depression), sleep disturbance and suicidal ideas. The patient is not nervous/anxious.        Depression well managed on citalopram    Today's Vitals   08/12/20 0955  BP: (!) 100/58  Pulse: 72  Resp: 16  Temp: (!) 97.4 F (36.3 C)  SpO2: 98%  Weight: 234 lb 9.6 oz (106.4 kg)  Height: 5' 9"  (1.753 m)   Body mass index is 34.64 kg/m.  Physical Exam Vitals and nursing note reviewed.  Constitutional:      General: He is not in acute distress.    Appearance: Normal appearance. He is well-developed. He is not diaphoretic.  HENT:     Head: Normocephalic and atraumatic.     Right Ear: Tympanic membrane is erythematous and bulging.  Left Ear: Tympanic membrane is erythematous and bulging.     Nose: Nose normal.     Mouth/Throat:     Mouth: Mucous membranes are moist.     Pharynx: No oropharyngeal exudate.  Eyes:     Pupils: Pupils are equal, round, and reactive to light.  Neck:     Thyroid: No thyromegaly.     Vascular: No JVD.     Trachea: No tracheal deviation.  Cardiovascular:     Rate and Rhythm: Normal rate and regular rhythm.     Heart sounds: Normal heart sounds. No murmur heard.  No friction rub. No gallop.   Pulmonary:     Effort: Pulmonary effort is normal. No respiratory distress.     Breath sounds: Normal breath sounds. No wheezing or rales.  Chest:     Chest wall: No tenderness.  Abdominal:     General: Bowel sounds are normal.     Palpations: Abdomen is soft.     Tenderness: There is no abdominal tenderness.  Musculoskeletal:        General: Normal range of motion.     Cervical back: Normal range of motion and neck supple.  Lymphadenopathy:     Cervical: No cervical adenopathy.  Skin:    General: Skin is warm and dry.  Neurological:     Mental Status: He is alert and oriented to person, place, and time.     Cranial Nerves: No cranial nerve deficit.  Psychiatric:        Mood and Affect: Mood normal.        Behavior: Behavior normal.         Thought Content: Thought content normal.        Judgment: Judgment normal.    Assessment/Plan: 1. Gastroesophageal reflux disease without esophagitis Change omeprazole to pantoprazole 29m daily. Discussed avoiding trigger foods and sleeping with HOB raised to 30 degrees.   2. Acute serous otitis media, recurrence not specified, unspecified laterality Cortisporin ear drops. Use 4 drops in both ears twice daily for the next week. Discussed possible referral to ENT provider for further evaluation if no improvement.  - neomycin-polymyxin-hydrocortisone (CORTISPORIN) 3.5-10000-1 ophthalmic suspension; Insert 4 drops in both ears twice daily for 7 days  Dispense: 7.5 mL; Refill: 0  General Counseling: Lanorris verbalizes understanding of the findings of todays visit and agrees with plan of treatment. I have discussed any further diagnostic evaluation that may be needed or ordered today. We also reviewed his medications today. he has been encouraged to call the office with any questions or concerns that should arise related to todays visit.   This patient was seen [place] F[place]vera Guiseas a part of collaborative care agreement  Meds ordered this encounter  Medications  . neomycin-polymyxin-hydrocortisone (CORTISPORIN) 3.5-10000-1 ophthalmic suspension    Sig: Insert 4 drops in both ears twice daily for 7 days    Dispense:  7.5 mL    Refill:  0    Written prescription provided today    Order Specific Question:   Supervising Provider    Answer:   KLavera Guise[[place] . DISCONTD: pantoprazole (PROTONIX) 40 MG tablet    Sig: Take 1 tablet (40 mg total) by mouth daily.    Dispense:  30 tablet    Refill:  3    Written prescription provided today    Order Specific Question:   Supervising Provider    Answer:   KLavera Guise[[id number]]  Total time spent: 25 Minutes   Time spent includes review of chart, medications, test results, and follow up plan with  the patient.      Dr Lavera Guise Internal medicine

## 2020-08-17 ENCOUNTER — Other Ambulatory Visit: Payer: Self-pay

## 2020-08-17 DIAGNOSIS — K219 Gastro-esophageal reflux disease without esophagitis: Secondary | ICD-10-CM

## 2020-08-17 MED ORDER — PANTOPRAZOLE SODIUM 40 MG PO TBEC: 40.0000 mg | DELAYED_RELEASE_TABLET | Freq: Every day | ORAL | 3 refills | Status: DC

## 2020-08-26 ENCOUNTER — Encounter: Payer: Self-pay | Admitting: Nurse Practitioner

## 2020-08-26 ENCOUNTER — Other Ambulatory Visit: Payer: Self-pay

## 2020-08-26 ENCOUNTER — Ambulatory Visit: Payer: 59 | Admitting: Nurse Practitioner

## 2020-08-26 VITALS — BP 112/72 | HR 75 | Temp 97.7°F | Resp 16 | Ht 69.0 in | Wt 235.4 lb

## 2020-08-26 DIAGNOSIS — H65196 Other acute nonsuppurative otitis media, recurrent, bilateral: Secondary | ICD-10-CM | POA: Diagnosis not present

## 2020-08-26 DIAGNOSIS — H65113 Acute and subacute allergic otitis media (mucoid) (sanguinous) (serous), bilateral: Secondary | ICD-10-CM

## 2020-08-26 MED ORDER — SULFAMETHOXAZOLE-TRIMETHOPRIM 800-160 MG PO TABS: 1.0000 | ORAL_TABLET | Freq: Two times a day (BID) | ORAL | 0 refills | Status: DC

## 2020-08-26 NOTE — Progress Notes (Signed)
[facility] [address] [place], [place] [address]  Internal MEDICINE  Office Visit Note  Patient Name: Alfred Gross  [id number]  [id number]  Date of Service: 08/31/2020  Chief Complaint  Patient presents with  . Acute Visit  . Ear Pain    right ear pain started yesterday, hurts to bite down or open mouth wide, muffled, feels like fluid in right ear     The patient is here for acute visit.  Continues to have bilateral ear pain. Has had chronic otitis and sinus infections. Has been treated multiple times over the past year for ENT infections. Was last treated two weeks ago with cortisporin ear drops. On 08/12/2020. He states that symptoms did get slightly better, but then came right back after he finished the week of ear drops. He denies headache. States that he does have some nasal and sinus congestion. He has also noted post nasal drip. We discussed referral to ENT at his last visit if infection or ear pain was persistent.     Pt is here for a sick visit.     Current Medication:  Outpatient Encounter Medications as of 08/26/2020  Medication Sig  . albuterol (PROVENTIL HFA;VENTOLIN HFA) 108 (90 Base) MCG/ACT inhaler Inhale 2 puffs into the lungs every 6 (six) hours as needed for wheezing or shortness of breath.  . cetirizine (ZYRTEC) 10 MG tablet Take 10 mg by mouth daily.  . citalopram (CELEXA) 10 MG tablet Take 1 tablet (10 mg total) by mouth daily.  . fluticasone (FLONASE) 50 MCG/ACT nasal spray Place 2 sprays into both nostrils 2 (two) times a day.  . Vitamin D, Ergocalciferol, (DRISDOL) 1.25 MG (50000 UNIT) CAPS capsule Take 1 capsule (50,000 Units total) by mouth every 7 (seven) days.  . [DISCONTINUED] pantoprazole (PROTONIX) 40 MG tablet Take 1 tablet (40 mg total) by mouth daily.  Marland Kitchen neomycin-polymyxin-hydrocortisone (CORTISPORIN) 3.5-10000-1 ophthalmic suspension Insert 4 drops in both ears twice daily for 7 days  . sulfamethoxazole-trimethoprim  (BACTRIM DS) 800-160 MG tablet Take 1 tablet by mouth 2 (two) times daily.   No facility-administered encounter medications on file as of 08/26/2020.      Medical History: Past Medical History:  Diagnosis Date  . Family history of colon cancer   . Family history of leukemia   . Family history of Lynch syndrome   . Family history of prostate cancer     Today's Vitals   08/26/20 1059  BP: 112/72  Pulse: 75  Resp: 16  Temp: 97.7 F (36.5 C)  SpO2: 97%  Weight: 235 lb 6.4 oz (106.8 kg)  Height: 5\' 9"  (1.753 m)   Body mass index is 34.76 kg/m.  Review of Systems  Constitutional: Negative for activity change, chills, fatigue, fever and unexpected weight change.  HENT: Positive for congestion, ear pain, postnasal drip, sinus pressure and sinus pain. Negative for rhinorrhea, sneezing and sore throat.   Respiratory: Negative for cough, chest tightness, shortness of breath and wheezing.   Cardiovascular: Negative for chest pain and palpitations.  Gastrointestinal: Negative for abdominal pain, constipation, diarrhea, nausea and vomiting.       Severe acid reflux if he does not take PPI  Endocrine: Negative for cold intolerance, heat intolerance, polydipsia and polyuria.  Musculoskeletal: Negative for arthralgias, back pain, joint swelling and neck pain.  Skin: Negative for rash.  Allergic/Immunologic: Positive for environmental allergies.  Neurological: Positive for headaches. Negative for dizziness, tremors and numbness.  Hematological: Negative for adenopathy. Does not bruise/bleed easily.  Psychiatric/Behavioral: Negative for behavioral problems (Depression), sleep disturbance and suicidal ideas. The patient is not nervous/anxious.        Depression well managed on citalopram    Physical Exam Vitals and nursing note reviewed.  Constitutional:      General: He is not in acute distress.    Appearance: Normal appearance. He is well-developed. He is ill-appearing. He is not  diaphoretic.  HENT:     Head: Normocephalic and atraumatic.     Right Ear: Tenderness present. There is mastoid tenderness. Tympanic membrane is erythematous and bulging.     Left Ear: Tenderness present. There is mastoid tenderness. Tympanic membrane is erythematous and bulging.     Nose: Congestion present.     Right Turbinates: Swollen.     Left Turbinates: Swollen.     Right Sinus: Maxillary sinus tenderness and frontal sinus tenderness present.     Left Sinus: Maxillary sinus tenderness and frontal sinus tenderness present.     Mouth/Throat:     Pharynx: No oropharyngeal exudate.  Eyes:     Pupils: Pupils are equal, round, and reactive to light.  Neck:     Thyroid: No thyromegaly.     Vascular: No JVD.     Trachea: No tracheal deviation.  Cardiovascular:     Rate and Rhythm: Normal rate and regular rhythm.     Heart sounds: Normal heart sounds. No murmur heard.  No friction rub. No gallop.   Pulmonary:     Effort: Pulmonary effort is normal. No respiratory distress.     Breath sounds: Normal breath sounds. No wheezing or rales.  Chest:     Chest wall: No tenderness.  Abdominal:     Palpations: Abdomen is soft.  Musculoskeletal:        General: Normal range of motion.     Cervical back: Normal range of motion and neck supple.  Lymphadenopathy:     Cervical: Cervical adenopathy present.  Skin:    General: Skin is warm and dry.  Neurological:     Mental Status: He is alert and oriented to person, place, and time.     Cranial Nerves: No cranial nerve deficit.  Psychiatric:        Mood and Affect: Mood normal.        Behavior: Behavior normal.        Thought Content: Thought content normal.        Judgment: Judgment normal.   Assessment/Plan: 1. Acute mucoid otitis media of both ears Start bactrim DS twice daily for 10 days. Repeat round of cortisporin ear drops, using four drops in both ears twice daily for additional week. Recommend tylenol OTC for pain.  -  sulfamethoxazole-trimethoprim (BACTRIM DS) 800-160 MG tablet; Take 1 tablet by mouth 2 (two) times daily.  Dispense: 20 tablet; Refill: 0  2. Other recurrent acute nonsuppurative otitis media of both ears Refer to ENT for further evaluation and treatment . - Ambulatory referral to ENT  General Counseling: phillipe clemon understanding of the findings of todays visit and agrees with plan of treatment. I have discussed any further diagnostic evaluation that may be needed or ordered today. We also reviewed his medications today. he has been encouraged to call the office with any questions or concerns that should arise related to todays visit.    Counseling:  This patient was seen by Vincent Gros FNP Collaboration with Dr Lyndon Code as a part of collaborative care agreement  Orders Placed This Encounter  Procedures  .  Ambulatory referral to ENT    Meds ordered this encounter  Medications  . sulfamethoxazole-trimethoprim (BACTRIM DS) 800-160 MG tablet    Sig: Take 1 tablet by mouth 2 (two) times daily.    Dispense:  20 tablet    Refill:  0    Order Specific Question:   Supervising Provider    Answer:   Lyndon Code [[id number]]    Time spent: 30 Minutes

## 2020-08-29 ENCOUNTER — Ambulatory Visit
Admission: EM | Admit: 2020-08-29 | Discharge: 2020-08-29 | Disposition: A | Discharge status: Home / Self Care | Payer: 59 | Attending: Family Medicine | Admitting: Family Medicine

## 2020-08-29 ENCOUNTER — Telehealth: Payer: Self-pay

## 2020-08-29 ENCOUNTER — Other Ambulatory Visit: Payer: Self-pay

## 2020-08-29 DIAGNOSIS — H9201 Otalgia, right ear: Secondary | ICD-10-CM | POA: Diagnosis not present

## 2020-08-29 MED ORDER — KETOROLAC TROMETHAMINE 10 MG PO TABS: 10.0000 mg | ORAL_TABLET | Freq: Four times a day (QID) | ORAL | 0 refills | Status: DC | PRN

## 2020-08-29 NOTE — ED Triage Notes (Signed)
Patient states that he has been having right ear pain that started Wednesday. States that he saw his PCP on Friday and was given Bactrim and neomycin drops for an ear infection. States that this has made no difference in his pain. States that ibuprofen is the only things that seems to be helping.   Patient states that he has pain when he bites down and shoots down his neck, and moving his jaw seems to make it worse, Patient is seeing ENT on 11/9.

## 2020-08-29 NOTE — Telephone Encounter (Signed)
He needs to get seen in urgent care and we need to get his referral to ENT. But urgent care for now. I think I have done everything I can.

## 2020-08-29 NOTE — Discharge Instructions (Signed)
Medication as prescribed.  No more Ibuprofen while on this medication.  See ENT.  Take care  Dr Adriana Simas

## 2020-08-29 NOTE — Telephone Encounter (Signed)
Spoke with pt, informed him to go to an urgent care per [facility], and let him know we already have the referral to ENT and they should reach out to him to make the appointment.

## 2020-08-29 NOTE — ED Provider Notes (Signed)
[facility]    CSN: [id number] Arrival date & time: 08/29/20  1135      History   Chief Complaint Chief Complaint  Patient presents with  . Otalgia   HPI   29 year old male presents with ear pain.  Patient reports ongoing right ear pain that started on Wednesday.  He saw his PCP and was placed on Bactrim and was also given eardrops.  He states that he has had no improvement.  Pain response to ibuprofen but once it wears off it returns.  He reports pain around the ear which goes downwards to the neck.  Seems to be worse when he open and closes his jaw as well as bites down.  He has an upcoming appoint with ENT.  No fever.  No drainage from the ear.  No other associated symptoms.  No other complaints.  Past Medical History:  Diagnosis Date  . Family history of colon cancer   . Family history of leukemia   . Family history of Lynch syndrome   . Family history of prostate cancer     Patient Active Problem List   Diagnosis Date Noted  . Mixed hyperlipidemia 02/06/2020  . Other fatigue 02/06/2020  . Acute conjunctivitis of left eye 12/31/2019  . Genetic testing 12/15/2019  . Family history of colon cancer   . Family history of prostate cancer   . Family history of Lynch syndrome   . Family history of leukemia   . Acute recurrent sinusitis 09/09/2019  . Allergic rhinitis due to pollen 07/03/2019  . Contact dermatitis and eczema due to plant 05/29/2019  . Vitamin D deficiency 03/01/2019  . Flu-like symptoms 03/05/2018  . Upper respiratory tract infection 03/05/2018  . Wheezing 03/05/2018  . Anxiety 03/03/2018  . GERD (gastroesophageal reflux disease) 03/03/2018  . Tobacco use 03/03/2018    History reviewed. No pertinent surgical history.     Home Medications    Prior to Admission medications   Medication Sig Start Date End Date Taking? Authorizing Provider  albuterol (PROVENTIL HFA;VENTOLIN HFA) 108 (90 Base) MCG/ACT inhaler Inhale 2 puffs into the  lungs every 6 (six) hours as needed for wheezing or shortness of breath. 02/17/18  Yes Boscia, Heather E, NP  cetirizine (ZYRTEC) 10 MG tablet Take 10 mg by mouth daily.   Yes [provider]  citalopram (CELEXA) 10 MG tablet Take 1 tablet (10 mg total) by mouth daily. 12/07/19  Yes Scarboro, Audie Clear, NP  fluticasone (FLONASE) 50 MCG/ACT nasal spray Place 2 sprays into both nostrils 2 (two) times a day. 05/15/19  Yes Scarboro, Audie Clear, NP  neomycin-polymyxin-hydrocortisone (CORTISPORIN) 3.5-10000-1 ophthalmic suspension Insert 4 drops in both ears twice daily for 7 days 08/12/20  Yes Boscia, Heather E, NP  sulfamethoxazole-trimethoprim (BACTRIM DS) 800-160 MG tablet Take 1 tablet by mouth 2 (two) times daily. 08/26/20  Yes Boscia, Greer Ee, NP  Vitamin D, Ergocalciferol, (DRISDOL) 1.25 MG (50000 UNIT) CAPS capsule Take 1 capsule (50,000 Units total) by mouth every 7 (seven) days. 01/22/20  Yes Boscia, Greer Ee, NP  ketorolac (TORADOL) 10 MG tablet Take 1 tablet (10 mg total) by mouth every 6 (six) hours as needed for moderate pain or severe pain. 08/29/20   Coral Spikes, DO  pantoprazole (PROTONIX) 40 MG tablet Take 1 tablet (40 mg total) by mouth daily. 08/17/20 08/29/20  Ronnell Freshwater, NP    Family History Family History  Problem Relation Age of Onset  . Healthy Mother   .  Colon cancer Father        MSH2+ Lynch syndrome, CHEK2+  . Colon cancer Paternal Uncle 75  . Colon cancer Paternal Grandfather        "Lynch+"  . Prostate cancer Paternal Grandfather        dx 85s  . Leukemia Half-Brother 16  . Colon cancer Half-Brother 24       "Lynch+"    Social History Social History   Tobacco Use  . Smoking status: Former Smoker    Packs/day: 1.00    Years: 10.00    Pack years: 10.00    Types: Cigarettes    Quit date: 11/28/2016    Years since quitting: 3.7  . Smokeless tobacco: Never Used  Vaping Use  . Vaping Use: Never used  Substance Use Topics  . Alcohol use: No  . Drug  use: No     Allergies   Penicillins   Review of Systems Review of Systems  HENT: Positive for ear pain.    Physical Exam Triage Vital Signs ED Triage Vitals  Enc Vitals Group     BP 08/29/20 1211 129/71     Pulse Rate 08/29/20 1211 83     Resp 08/29/20 1211 18     Temp 08/29/20 1211 98 F (36.7 C)     Temp Source 08/29/20 1211 Oral     SpO2 08/29/20 1211 98 %     Weight 08/29/20 1209 234 lb (106.1 kg)     Height 08/29/20 1209 5' 9"  (1.753 m)     Head Circumference --      Peak Flow --      Pain Score 08/29/20 1209 8     Pain Loc --      Pain Edu? --      Excl. in [place]? --    Updated Vital Signs BP 129/71 (BP Location: Right Arm)   Pulse 83   Temp 98 F (36.7 C) (Oral)   Resp 18   Ht 5' 9"  (1.753 m)   Wt 106.1 kg   SpO2 98%   BMI 34.56 kg/m   Visual Acuity Right Eye Distance:   Left Eye Distance:   Bilateral Distance:    Right Eye Near:   Left Eye Near:    Bilateral Near:     Physical Exam Vitals and nursing note reviewed.  Constitutional:      Appearance: Normal appearance.  HENT:     Head: Normocephalic and atraumatic.     Right Ear: Tympanic membrane normal.     Left Ear: Tympanic membrane normal.  Eyes:     General:        Right eye: No discharge.        Left eye: No discharge.     Conjunctiva/sclera: Conjunctivae normal.  Cardiovascular:     Rate and Rhythm: Normal rate and regular rhythm.     Heart sounds: No murmur heard.   Pulmonary:     Effort: Pulmonary effort is normal.     Breath sounds: Normal breath sounds. No wheezing, rhonchi or rales.  Neurological:     Mental Status: He is alert.  Psychiatric:        Mood and Affect: Mood normal.        Behavior: Behavior normal.      UC Treatments / Results  Labs (all labs ordered are listed, but only abnormal results are displayed) Labs Reviewed - No data to display  EKG   Radiology No results found.  Procedures  Procedures (including critical care time)  Medications  Ordered in UC Medications - No data to display  Initial Impression / Assessment and Plan / UC Course  I have reviewed the triage vital signs and the nursing notes.  Pertinent labs & imaging results that were available during my care of the patient were reviewed by me and considered in my medical decision making (see chart for details).    29 year old male presents with ear pain.  No evidence of infection on exam.  Patient's description of his pain seems more like TMJ.  He has an upcoming appointment with ENT.  Advised to stop antibiotics.  Toradol as needed.  Supportive care.  Final Clinical Impressions(s) / UC Diagnoses   Final diagnoses:  Otalgia of right ear     Discharge Instructions     Medication as prescribed.  No more Ibuprofen while on this medication.  See ENT.  Take care  Dr Lacinda Axon    ED Prescriptions    Medication Sig Dispense Auth. Provider   ketorolac (TORADOL) 10 MG tablet Take 1 tablet (10 mg total) by mouth every 6 (six) hours as needed for moderate pain or severe pain. 20 tablet Coral Spikes, DO     PDMP not reviewed this encounter.   Coral Spikes, DO 08/29/20 1420

## 2020-08-31 DIAGNOSIS — H6690 Otitis media, unspecified, unspecified ear: Secondary | ICD-10-CM | POA: Insufficient documentation

## 2020-08-31 DIAGNOSIS — H65113 Acute and subacute allergic otitis media (mucoid) (sanguinous) (serous), bilateral: Secondary | ICD-10-CM | POA: Insufficient documentation

## 2020-08-31 DIAGNOSIS — H6500 Acute serous otitis media, unspecified ear: Secondary | ICD-10-CM | POA: Insufficient documentation

## 2020-09-06 ENCOUNTER — Other Ambulatory Visit: Payer: Self-pay | Admitting: Physician Assistant

## 2020-09-06 DIAGNOSIS — H838X9 Other specified diseases of inner ear, unspecified ear: Secondary | ICD-10-CM

## 2020-09-21 ENCOUNTER — Ambulatory Visit: Payer: 59

## 2020-10-14 ENCOUNTER — Other Ambulatory Visit: Payer: Self-pay | Admitting: Adult Health

## 2020-10-14 DIAGNOSIS — F411 Generalized anxiety disorder: Secondary | ICD-10-CM

## 2020-11-02 ENCOUNTER — Other Ambulatory Visit: Payer: Self-pay | Admitting: Adult Health

## 2020-11-02 DIAGNOSIS — F411 Generalized anxiety disorder: Secondary | ICD-10-CM

## 2020-11-08 ENCOUNTER — Ambulatory Visit: Payer: Managed Care, Other (non HMO) | Admitting: Nurse Practitioner

## 2020-11-08 ENCOUNTER — Encounter: Payer: Self-pay | Admitting: Nurse Practitioner

## 2020-11-08 VITALS — HR 88 | Resp 16 | Ht 69.0 in | Wt 235.0 lb

## 2020-11-08 DIAGNOSIS — J069 Acute upper respiratory infection, unspecified: Secondary | ICD-10-CM | POA: Diagnosis not present

## 2020-11-08 DIAGNOSIS — R509 Fever, unspecified: Secondary | ICD-10-CM | POA: Insufficient documentation

## 2020-11-08 MED ORDER — SULFAMETHOXAZOLE-TRIMETHOPRIM 800-160 MG PO TABS: 1.0000 | ORAL_TABLET | Freq: Two times a day (BID) | ORAL | 0 refills | Status: DC

## 2020-11-08 NOTE — Progress Notes (Signed)
[facility] [place], [place] [address]  Internal MEDICINE  Telephone Visit  Patient Name: Alfred Gross  [id number]  [id number]  Date of Service: 11/08/2020  I connected with the patient at 12:29pm by telephone and verified the patients identity using two identifiers.   I discussed the limitations, risks, security and privacy concerns of performing an evaluation and management service by telephone and the availability of in person appointments. I also discussed with the patient that there may be a patient responsible charge related to the service.  The patient expressed understanding and agrees to proceed.    Chief Complaint  Patient presents with  . Acute Visit    High fever since yesterday, chills, not vaccinated, rotating ibuprofen and tylenol every 4 hours temp will go back to 102, refill request  . Telephone Assessment    [phone]   . Telephone Screen    Video call    The patient has been contacted via telephone for follow up visit due to concerns for spread of novel coronavirus.  The patient states that he has high fever, about 102 or 103 and intermittent cough. Symptoms started yesterday. Patient states that his wife was diagnosed with COVID 75. He states that he quarantined in his camper for 2 weeks. He has had no symptoms until yesterday. He has taken two home tests for COVID 19 and both were negative. Patient is not vaccinated. He has been taking OTC elderberry, Zinc, vitamin c, and vitamin D. He has also been alternating tylenol and ibuprofen to manage fever.       Current Medication: Outpatient Encounter Medications as of 11/08/2020  Medication Sig  . albuterol (PROVENTIL HFA;VENTOLIN HFA) 108 (90 Base) MCG/ACT inhaler Inhale 2 puffs into the lungs every 6 (six) hours as needed for wheezing or shortness of breath.  . cetirizine (ZYRTEC) 10 MG tablet Take 10 mg by mouth daily.  . citalopram (CELEXA) 10 MG tablet Take 1 tablet (10 mg  total) by mouth daily.  . fluticasone (FLONASE) 50 MCG/ACT nasal spray Place 2 sprays into both nostrils 2 (two) times a day.  Marland Kitchen omeprazole (PRILOSEC) 10 MG capsule Take 10 mg by mouth daily.  Marland Kitchen sulfamethoxazole-trimethoprim (BACTRIM DS) 800-160 MG tablet Take 1 tablet by mouth 2 (two) times daily.  . [DISCONTINUED] ketorolac (TORADOL) 10 MG tablet Take 1 tablet (10 mg total) by mouth every 6 (six) hours as needed for moderate pain or severe pain. (Patient not taking: Reported on 11/08/2020)  . [DISCONTINUED] neomycin-polymyxin-hydrocortisone (CORTISPORIN) 3.5-10000-1 ophthalmic suspension Insert 4 drops in both ears twice daily for 7 days (Patient not taking: Reported on 11/08/2020)  . [DISCONTINUED] pantoprazole (PROTONIX) 40 MG tablet Take 1 tablet (40 mg total) by mouth daily.  . [DISCONTINUED] sulfamethoxazole-trimethoprim (BACTRIM DS) 800-160 MG tablet Take 1 tablet by mouth 2 (two) times daily. (Patient not taking: Reported on 11/08/2020)  . [DISCONTINUED] Vitamin D, Ergocalciferol, (DRISDOL) 1.25 MG (50000 UNIT) CAPS capsule Take 1 capsule (50,000 Units total) by mouth every 7 (seven) days. (Patient not taking: Reported on 11/08/2020)   No facility-administered encounter medications on file as of 11/08/2020.    Surgical History: No past surgical history on file.  Medical History: Past Medical History:  Diagnosis Date  . Family history of colon cancer   . Family history of leukemia   . Family history of Lynch syndrome   . Family history of prostate cancer     Family History: Family History  Problem Relation Age of Onset  .  Healthy Mother   . Colon cancer Father        MSH2+ Lynch syndrome, CHEK2+  . Colon cancer Paternal Uncle 48  . Colon cancer Paternal Grandfather        "Lynch+"  . Prostate cancer Paternal Grandfather        dx 57s  . Leukemia Half-Brother 16  . Colon cancer Half-Brother 39       "Lynch+"    Social History   Socioeconomic History  . Marital status:  Married    Spouse name: Not on file  . Number of children: Not on file  . Years of education: Not on file  . Highest education level: Not on file  Occupational History  . Not on file  Tobacco Use  . Smoking status: Former Smoker    Packs/day: 1.00    Years: 10.00    Pack years: 10.00    Types: Cigarettes    Quit date: 11/28/2016    Years since quitting: 3.9  . Smokeless tobacco: Never Used  Vaping Use  . Vaping Use: Never used  Substance and Sexual Activity  . Alcohol use: No  . Drug use: No  . Sexual activity: Not on file  Other Topics Concern  . Not on file  Social History Narrative  . Not on file   Social Determinants of Health   Financial Resource Strain: Not on file  Food Insecurity: Not on file  Transportation Needs: Not on file  Physical Activity: Not on file  Stress: Not on file  Social Connections: Not on file  Intimate Partner Violence: Not on file      Review of Systems  Constitutional: Positive for fatigue and fever. Negative for chills and unexpected weight change.  HENT: Negative for congestion, postnasal drip, rhinorrhea, sneezing and sore throat.   Respiratory: Positive for cough and wheezing. Negative for chest tightness and shortness of breath.   Cardiovascular: Negative for chest pain and palpitations.  Gastrointestinal: Negative for abdominal pain, constipation, diarrhea, nausea and vomiting.  Musculoskeletal: Negative for arthralgias, back pain, joint swelling and neck pain.  Skin: Negative for rash.  Allergic/Immunologic: Positive for environmental allergies.  Neurological: Negative for dizziness, tremors, numbness and headaches.  Hematological: Negative for adenopathy. Does not bruise/bleed easily.  Psychiatric/Behavioral: Negative for behavioral problems (Depression), sleep disturbance and suicidal ideas. The patient is not nervous/anxious.     Today's Vitals   11/08/20 1153  Pulse: 88  Resp: 16  SpO2: 99%  Weight: 235 lb (106.6 kg)   Height: 5' 9" (1.753 m)   Body mass index is 34.7 kg/m.  Observation/Objective:  The patient is alert and oriented. He is pleasant and answering all questions appropriately. Breathing is non-labored. He is in no acute distress.  Had intermittent dry cough noted.    Assessment/Plan: 1. Acute upper respiratory infection Unclear cause. Has had two negative home tests for COVID 19. Will start bactrim DS twice daily for 10 days. Recommend he get PCR test for COVID and test for flu if possible. He should also take OTC zinc, vitamin d, and vitamin c every day. Can also use otc medications as needed and as indicated for symptom management  - sulfamethoxazole-trimethoprim (BACTRIM DS) 800-160 MG tablet; Take 1 tablet by mouth 2 (two) times daily.  Dispense: 20 tablet; Refill: 0  2. Fever, unspecified fever cause Recommend he continues with alternation of tylenol and ibuprofen to treat fever and body aches.   General Counseling: henrique parekh understanding of the findings of today's  phone visit and agrees with plan of treatment. I have discussed any further diagnostic evaluation that may be needed or ordered today. We also reviewed his medications today. he has been encouraged to call the office with any questions or concerns that should arise related to todays visit.      Person Under Monitoring Name: Alfred Gross  Location: [address] [facility] [address]   Infection Prevention Recommendations for Individuals Confirmed to have, or Being Evaluated for, 2019 Novel Coronavirus (COVID-19) Infection Who Receive Care at Home  Individuals who are confirmed to have, or are being evaluated for, COVID-19 should follow the prevention steps below until a healthcare provider or local or state health department says they can return to normal activities.  Stay home except to get medical care You should restrict activities outside your home, except for getting medical care. Do  not go to work, school, or public areas, and do not use public transportation or taxis.  Call ahead before visiting your doctor Before your medical appointment, call the healthcare provider and tell them that you have, or are being evaluated for, COVID-19 infection. This will help the healthcare provider's office take steps to keep other people from getting infected. Ask your healthcare provider to call the local or state health department.  Monitor your symptoms Seek prompt medical attention if your illness is worsening (e.g., difficulty breathing). Before going to your medical appointment, call the healthcare provider and tell them that you have, or are being evaluated for, COVID-19 infection. Ask your healthcare provider to call the local or state health department.  Wear a facemask You should wear a facemask that covers your nose and mouth when you are in the same room with other people and when you visit a healthcare provider. People who live with or visit you should also wear a facemask while they are in the same room with you.  Separate yourself from other people in your home As much as possible, you should stay in a different room from other people in your home. Also, you should use a separate bathroom, if available.  Avoid sharing household items You should not share dishes, drinking glasses, cups, eating utensils, towels, bedding, or other items with other people in your home. After using these items, you should wash them thoroughly with soap and water.  Cover your coughs and sneezes Cover your mouth and nose with a tissue when you cough or sneeze, or you can cough or sneeze into your sleeve. Throw used tissues in a lined trash can, and immediately wash your hands with soap and water for at least 20 seconds or use an alcohol-based hand rub.  Wash your [facility] your hands often and thoroughly with soap and water for at least 20 seconds. You can use an alcohol-based  hand sanitizer if soap and water are not available and if your hands are not visibly dirty. Avoid touching your eyes, nose, and mouth with unwashed hands.   Prevention Steps for Caregivers and Household Members of Individuals Confirmed to have, or Being Evaluated for, COVID-19 Infection Being Cared for in the Home  If you live with, or provide care at home for, a person confirmed to have, or being evaluated for, COVID-19 infection please follow these guidelines to prevent infection:  Follow healthcare provider's instructions Make sure that you understand and can help the patient follow any healthcare provider instructions for all care.  Provide for the patient's basic needs You should help the patient with basic needs  in the home and provide support for getting groceries, prescriptions, and other personal needs.  Monitor the patient's symptoms If they are getting sicker, call his or her medical provider and tell them that the patient has, or is being evaluated for, COVID-19 infection. This will help the healthcare provider's office take steps to keep other people from getting infected. Ask the healthcare provider to call the local or state health department.  Limit the number of people who have contact with the patient  If possible, have only one caregiver for the patient.  Other household members should stay in another home or place of residence. If this is not possible, they should stay  in another room, or be separated from the patient as much as possible. Use a separate bathroom, if available.  Restrict visitors who do not have an essential need to be in the home.  Keep older adults, very young children, and other sick people away from the patient Keep older adults, very young children, and those who have compromised immune systems or chronic health conditions away from the patient. This includes people with chronic heart, lung, or kidney conditions, diabetes, and  cancer.  Ensure good ventilation Make sure that shared spaces in the home have good air flow, such as from an air conditioner or an opened window, weather permitting.  Wash your hands often  Wash your hands often and thoroughly with soap and water for at least 20 seconds. You can use an alcohol based hand sanitizer if soap and water are not available and if your hands are not visibly dirty.  Avoid touching your eyes, nose, and mouth with unwashed hands.  Use disposable paper towels to dry your hands. If not available, use dedicated cloth towels and replace them when they become wet.  Wear a facemask and gloves  Wear a disposable facemask at all times in the room and gloves when you touch or have contact with the patient's blood, body fluids, and/or secretions or excretions, such as sweat, saliva, sputum, nasal mucus, vomit, urine, or feces.  Ensure the mask fits over your nose and mouth tightly, and do not touch it during use.  Throw out disposable facemasks and gloves after using them. Do not reuse.  Wash your hands immediately after removing your facemask and gloves.  If your personal clothing becomes contaminated, carefully remove clothing and launder. Wash your hands after handling contaminated clothing.  Place all used disposable facemasks, gloves, and other waste in a lined container before disposing them with other household waste.  Remove gloves and wash your hands immediately after handling these items.  Do not share dishes, glasses, or other household items with the patient  Avoid sharing household items. You should not share dishes, drinking glasses, cups, eating utensils, towels, bedding, or other items with a patient who is confirmed to have, or being evaluated for, COVID-19 infection.  After the person uses these items, you should wash them thoroughly with soap and water.  Wash laundry thoroughly  Immediately remove and wash clothes or bedding that have blood, body  fluids, and/or secretions or excretions, such as sweat, saliva, sputum, nasal mucus, vomit, urine, or feces, on them.  Wear gloves when handling laundry from the patient.  Read and follow directions on labels of laundry or clothing items and detergent. In general, wash and dry with the warmest temperatures recommended on the label.  Clean all areas the individual has used often  Clean all touchable surfaces, such as counters, tabletops, doorknobs, bathroom  fixtures, toilets, phones, keyboards, tablets, and bedside tables, every day. Also, clean any surfaces that may have blood, body fluids, and/or secretions or excretions on them.  Wear gloves when cleaning surfaces the patient has come in contact with.  Use a diluted bleach solution (e.g., dilute bleach with 1 part bleach and 10 parts water) or a household disinfectant with a label that says EPA-registered for coronaviruses. To make a bleach solution at home, add 1 tablespoon of bleach to 1 quart (4 cups) of water. For a larger supply, add  cup of bleach to 1 gallon (16 cups) of water.  Read labels of cleaning products and follow recommendations provided on product labels. Labels contain instructions for safe and effective use of the cleaning product including precautions you should take when applying the product, such as wearing gloves or eye protection and making sure you have good ventilation during use of the product.  Remove gloves and wash hands immediately after cleaning.  Monitor yourself for signs and symptoms of illness Caregivers and household members are considered close contacts, should monitor their health, and will be asked to limit movement outside of the home to the extent possible. Follow the monitoring steps for close contacts listed on the symptom monitoring form.   ? If you have additional questions, contact your local health department or call the epidemiologist on call at [phone] (available 24/7). ? This  guidance is subject to change. For the most up-to-date guidance from [facility], please refer to their website: YouBlogs.pl  This patient was seen by Leretha Pol FNP Collaboration with Dr Lavera Guise as a part of collaborative care agreement  Meds ordered this encounter  Medications  . sulfamethoxazole-trimethoprim (BACTRIM DS) 800-160 MG tablet    Sig: Take 1 tablet by mouth 2 (two) times daily.    Dispense:  20 tablet    Refill:  0    Order Specific Question:   Supervising Provider    Answer:   Lavera Guise [[id number]]    Time spent: 83 Minutes    Dr Lavera Guise Internal medicine

## 2020-11-10 ENCOUNTER — Other Ambulatory Visit: Payer: Self-pay

## 2020-11-10 ENCOUNTER — Telehealth: Payer: Self-pay

## 2020-11-10 ENCOUNTER — Ambulatory Visit
Admission: RE | Admit: 2020-11-10 | Discharge: 2020-11-10 | Disposition: A | Discharge status: Home / Self Care | Payer: Managed Care, Other (non HMO) | Attending: Nurse Practitioner | Admitting: Nurse Practitioner

## 2020-11-10 ENCOUNTER — Other Ambulatory Visit: Payer: Self-pay | Admitting: Nurse Practitioner

## 2020-11-10 ENCOUNTER — Ambulatory Visit
Admission: RE | Admit: 2020-11-10 | Discharge: 2020-11-10 | Disposition: A | Discharge status: Home / Self Care | Payer: Managed Care, Other (non HMO) | Source: Ambulatory Visit | Attending: Nurse Practitioner | Admitting: Nurse Practitioner

## 2020-11-10 DIAGNOSIS — F411 Generalized anxiety disorder: Secondary | ICD-10-CM

## 2020-11-10 DIAGNOSIS — R0602 Shortness of breath: Secondary | ICD-10-CM

## 2020-11-10 DIAGNOSIS — J069 Acute upper respiratory infection, unspecified: Secondary | ICD-10-CM

## 2020-11-10 DIAGNOSIS — R062 Wheezing: Secondary | ICD-10-CM

## 2020-11-10 MED ORDER — ALBUTEROL SULFATE HFA 108 (90 BASE) MCG/ACT IN AERS: 2.0000 | INHALATION_SPRAY | Freq: Four times a day (QID) | RESPIRATORY_TRACT | 2 refills | Status: DC | PRN

## 2020-11-10 MED ORDER — PREDNISONE 10 MG (21) PO TBPK: ORAL_TABLET | ORAL | 0 refills | Status: DC

## 2020-11-10 MED ORDER — CITALOPRAM HYDROBROMIDE 10 MG PO TABS: 10.0000 mg | ORAL_TABLET | Freq: Every day | ORAL | 1 refills | Status: DC

## 2020-11-10 NOTE — Telephone Encounter (Signed)
Spoke with pt, informed him of the meds called in and the cxr that needs to be done, also refilled his inhaler.

## 2020-11-10 NOTE — Telephone Encounter (Signed)
I added prednisone taper to his meds. Take as directed for 6 days. Also added order for chest x-ray. He can get this done at outpatient radiology on [address]. He should continue with antibiotics prescribed 11/08/2020 and over the counter medication to help with symptoms.

## 2020-11-11 NOTE — Progress Notes (Signed)
Please let the patient know that x-ray is showing evidence of bronchitis, but no pneumonia. He should continue antibiotics. Started steroid. Use rescue inhaler as needed and as prescribed. Please keep [place] posted regarding symptoms. Thanks.

## 2020-11-15 ENCOUNTER — Telehealth: Payer: Self-pay

## 2020-11-15 NOTE — Telephone Encounter (Signed)
Pt called that his anxiety is worse as oer dr Welton Flakes advised him to stopped prednisone and make appt in person to see dr for anxiety

## 2020-11-17 ENCOUNTER — Encounter: Payer: Self-pay | Admitting: Physician Assistant

## 2020-11-17 ENCOUNTER — Emergency Department: Payer: Managed Care, Other (non HMO)

## 2020-11-17 ENCOUNTER — Encounter: Payer: Self-pay | Admitting: Emergency Medicine

## 2020-11-17 ENCOUNTER — Ambulatory Visit: Payer: Managed Care, Other (non HMO) | Admitting: Internal Medicine

## 2020-11-17 ENCOUNTER — Other Ambulatory Visit: Payer: Self-pay

## 2020-11-17 DIAGNOSIS — U071 COVID-19: Secondary | ICD-10-CM | POA: Diagnosis not present

## 2020-11-17 DIAGNOSIS — F411 Generalized anxiety disorder: Secondary | ICD-10-CM

## 2020-11-17 DIAGNOSIS — R42 Dizziness and giddiness: Secondary | ICD-10-CM | POA: Diagnosis not present

## 2020-11-17 DIAGNOSIS — R200 Anesthesia of skin: Secondary | ICD-10-CM | POA: Diagnosis not present

## 2020-11-17 DIAGNOSIS — Z87891 Personal history of nicotine dependence: Secondary | ICD-10-CM | POA: Insufficient documentation

## 2020-11-17 DIAGNOSIS — M791 Myalgia, unspecified site: Secondary | ICD-10-CM | POA: Diagnosis present

## 2020-11-17 DIAGNOSIS — F19982 Other psychoactive substance use, unspecified with psychoactive substance-induced sleep disorder: Secondary | ICD-10-CM | POA: Diagnosis not present

## 2020-11-17 LAB — CBC
HCT: 43.4 % (ref 39.0–52.0)
Hemoglobin: 15.8 g/dL (ref 13.0–17.0)
MCH: 29.5 pg (ref 26.0–34.0)
MCHC: 36.4 g/dL — ABNORMAL HIGH (ref 30.0–36.0)
MCV: 81.1 fL (ref 80.0–100.0)
Platelets: 313 10*3/uL (ref 150–400)
RBC: 5.35 MIL/uL (ref 4.22–5.81)
RDW: 11.6 % (ref 11.5–15.5)
WBC: 11.5 10*3/uL — ABNORMAL HIGH (ref 4.0–10.5)
nRBC: 0 % (ref 0.0–0.2)

## 2020-11-17 LAB — BASIC METABOLIC PANEL
Anion gap: 11 (ref 5–15)
BUN: 12 mg/dL (ref 6–20)
CO2: 23 mmol/L (ref 22–32)
Calcium: 9.5 mg/dL (ref 8.9–10.3)
Chloride: 104 mmol/L (ref 98–111)
Creatinine, Ser: 0.71 mg/dL (ref 0.61–1.24)
GFR, Estimated: >60 mL/min (ref >60)
Glucose, Bld: 107 mg/dL — ABNORMAL HIGH (ref 70–99)
Potassium: 3.9 mmol/L (ref 3.5–5.1)
Sodium: 138 mmol/L (ref 135–145)

## 2020-11-17 LAB — TROPONIN I (HIGH SENSITIVITY)
Troponin I (High Sensitivity): 3 ng/L (ref <18)
Troponin I (High Sensitivity): 2 ng/L (ref <18)

## 2020-11-17 MED ORDER — CITALOPRAM HYDROBROMIDE 20 MG PO TABS
ORAL_TABLET | ORAL | 1 refills | Status: DC
Start: 2020-11-17 — End: 2021-05-29

## 2020-11-17 MED ORDER — ALPRAZOLAM 0.25 MG PO TABS
0.2500 mg | ORAL_TABLET | Freq: Two times a day (BID) | ORAL | 0 refills | Status: DC | PRN
Start: 2020-11-17 — End: 2021-04-05

## 2020-11-17 NOTE — ED Triage Notes (Signed)
First Nurse Note:  States symptoms have been ongoing for 2 days, also c/o chills, cold sweats and body aches.  Patient is AAOx3. Skin warm and dry. NAD

## 2020-11-17 NOTE — Progress Notes (Signed)
[facility] [place], [place] [address]  Internal MEDICINE  Office Visit Note  Patient Name: Alfred Gross  [id number]  [id number]  Date of Service: 11/22/2020  Chief Complaint  Patient presents with  . Acute Visit    Anxiety has been bad for the last 3 to 4 days, meds not helping, has made left arm and bottom lip numb sometimes seems numb when anxiety feels strong, pt doesn't feel right   . Anxiety    HPI  Pt has been tested positive for COVID on 11/11/2019, has been sick since 1/10, he was treated with Azithromycin and prednisone, since then pt has been anxious and is unable to sleep. COVID symptoms have improved. He does have baseline anxiety  Denies any fever or chills,   Current Medication: Outpatient Encounter Medications as of 11/17/2020  Medication Sig  . ALPRAZolam (XANAX) 0.25 MG tablet Take 1 tablet (0.25 mg total) by mouth 2 (two) times daily as needed for anxiety.  Marland Kitchen albuterol (VENTOLIN HFA) 108 (90 Base) MCG/ACT inhaler Inhale 2 puffs into the lungs every 6 (six) hours as needed for wheezing or shortness of breath.  . cetirizine (ZYRTEC) 10 MG tablet Take 10 mg by mouth daily.  . citalopram (CELEXA) 20 MG tablet Take one tab at night  . fluticasone (FLONASE) 50 MCG/ACT nasal spray Place 2 sprays into both nostrils 2 (two) times a day.  Marland Kitchen omeprazole (PRILOSEC) 10 MG capsule Take 10 mg by mouth daily.  . [DISCONTINUED] citalopram (CELEXA) 10 MG tablet Take 1 tablet (10 mg total) by mouth daily.  . [DISCONTINUED] pantoprazole (PROTONIX) 40 MG tablet Take 1 tablet (40 mg total) by mouth daily.  . [DISCONTINUED] predniSONE (STERAPRED UNI-PAK 21 TAB) 10 MG (21) TBPK tablet 6 day taper - take by mouth as directed for 6 days  . [DISCONTINUED] sulfamethoxazole-trimethoprim (BACTRIM DS) 800-160 MG tablet Take 1 tablet by mouth 2 (two) times daily.   No facility-administered encounter medications on file as of 11/17/2020.    Surgical  History: History reviewed. No pertinent surgical history.  Medical History: Past Medical History:  Diagnosis Date  . Anxiety   . Anxiety   . Family history of colon cancer   . Family history of leukemia   . Family history of Lynch syndrome   . Family history of prostate cancer     Family History: Family History  Problem Relation Age of Onset  . Healthy Mother   . Colon cancer Father        MSH2+ Lynch syndrome, CHEK2+  . Colon cancer Paternal Uncle 53  . Colon cancer Paternal Grandfather        "Lynch+"  . Prostate cancer Paternal Grandfather        dx 86s  . Leukemia Half-Brother 16  . Colon cancer Half-Brother 42       "Lynch+"    Social History   Socioeconomic History  . Marital status: Married    Spouse name: Not on file  . Number of children: Not on file  . Years of education: Not on file  . Highest education level: Not on file  Occupational History  . Not on file  Tobacco Use  . Smoking status: Former Smoker    Packs/day: 1.00    Years: 10.00    Pack years: 10.00    Types: Cigarettes    Quit date: 11/28/2016    Years since quitting: 3.9  . Smokeless tobacco: Never Used  Vaping Use  . Vaping  Use: Never used  Substance and Sexual Activity  . Alcohol use: No  . Drug use: No  . Sexual activity: Not on file  Other Topics Concern  . Not on file  Social History Narrative  . Not on file   Social Determinants of Health   Financial Resource Strain: Not on file  Food Insecurity: Not on file  Transportation Needs: Not on file  Physical Activity: Not on file  Stress: Not on file  Social Connections: Not on file  Intimate Partner Violence: Not on file      Review of Systems  Constitutional: Negative for fatigue and fever.  HENT: Negative for congestion, mouth sores and postnasal drip.   Respiratory: Negative.  Negative for cough.   Cardiovascular: Negative.  Negative for chest pain.  Gastrointestinal: Positive for nausea.  Genitourinary: Negative  for flank pain.  Neurological: Negative.   Psychiatric/Behavioral: Positive for agitation and dysphoric mood. Negative for self-injury and suicidal ideas.    Vital Signs: BP 132/84   Pulse 85   Temp 97.7 F (36.5 C)   Resp 16   Ht 5' 9"  (1.753 m)   Wt 224 lb 12.8 oz (102 kg)   SpO2 98%   BMI 33.20 kg/m    Physical Exam Constitutional:      Appearance: Normal appearance.  HENT:     Head: Normocephalic and atraumatic.     Mouth/Throat:     Mouth: Mucous membranes are dry.  Eyes:     Extraocular Movements: Extraocular movements intact.     Pupils: Pupils are equal, round, and reactive to light.  Cardiovascular:     Rate and Rhythm: Normal rate.     Heart sounds: No friction rub.  Pulmonary:     Effort: Pulmonary effort is normal.     Breath sounds: Rhonchi present.  Musculoskeletal:        General: Normal range of motion.  Skin:    General: Skin is warm.  Neurological:     General: No focal deficit present.     Mental Status: He is alert.     Motor: No weakness.     Gait: Gait normal.  Psychiatric:     Comments: Anxious         Assessment/Plan: 1. COVID Pt is improving, continue to monitor for other acute symptoms  2. GAD (generalized anxiety disorder) Will increase Celexa to 20 mg a day, add low dose Xanax  - ALPRAZolam (XANAX) 0.25 MG tablet; Take 1 tablet (0.25 mg total) by mouth 2 (two) times daily as needed for anxiety.  Dispense: 20 tablet; Refill: 0 - citalopram (CELEXA) 20 MG tablet; Take one tab at night  Dispense: 90 tablet; Refill: 1  3. 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  Dispense: 20 tablet; Refill: 0  General Counseling: Hung verbalizes understanding of the findings of todays visit and agrees with plan of treatment. I have discussed any further diagnostic evaluation that may be needed  or ordered today. We also reviewed his medications today. he has been encouraged to call the office with any questions or concerns that should arise related to todays visit.   [place] controlled substance stop act is reviewed    Meds ordered this encounter  Medications  . ALPRAZolam (XANAX) 0.25 MG tablet    Sig: Take 1 tablet (0.25 mg  total) by mouth 2 (two) times daily as needed for anxiety.    Dispense:  20 tablet    Refill:  0  . citalopram (CELEXA) 20 MG tablet    Sig: Take one tab at night    Dispense:  90 tablet    Refill:  1    Need follow up appt    Total time spent:25 Minutes Time spent includes review of chart, medications, test results, and follow up plan with the patient.      Dr Lavera Guise Internal medicine

## 2020-11-17 NOTE — ED Triage Notes (Signed)
Also c/o lightheadedness and intermittent left lower facial numbness intermittently.  Diagnosed with COVIED 1/13.  Symptom onset was 1/10.  AAOx3.  Skin warm and dry. NAD

## 2020-11-18 ENCOUNTER — Emergency Department
Admission: EM | Admit: 2020-11-18 | Discharge: 2020-11-18 | Disposition: A | Discharge status: Home / Self Care | Payer: Managed Care, Other (non HMO) | Attending: Emergency Medicine | Admitting: Emergency Medicine

## 2020-11-18 ENCOUNTER — Emergency Department: Payer: Managed Care, Other (non HMO)

## 2020-11-18 DIAGNOSIS — U071 COVID-19: Secondary | ICD-10-CM

## 2020-11-18 MED ORDER — ONDANSETRON 4 MG PO TBDP
4.0000 mg | ORAL_TABLET | Freq: Once | ORAL | Status: AC
  Administered 2020-11-18: 4 mg via ORAL
  Filled 2020-11-18: qty 1

## 2020-11-18 MED ORDER — ONDANSETRON 4 MG PO TBDP: 4.0000 mg | ORAL_TABLET | Freq: Three times a day (TID) | ORAL | 0 refills | Status: DC | PRN

## 2020-11-18 NOTE — ED Provider Notes (Signed)
[facility] Emergency Department Provider Note  ____________________________________________  Time seen: Approximately 3:42 AM  I have reviewed the triage vital signs and the nursing notes.   HISTORY  Chief Complaint Chest Pain   HPI Alfred Gross is a 30 y.o. male with a history of anxiety, GERD, smoking, hyperlipidemia and recently diagnosed with COVID a week ago who presents for evaluation of several complaints.  Patient reports that he had a fever and felt sick 11 days ago.  Tested positive for COVID.  He reports that he fully recovered but 2 days ago started feeling sick again.  Has had nausea, dry heaving, chills, cold sweats, body aches.  He is also complaining of intermittent lightheadedness.  Also complaining of intermittent left lower face (just next to the lip area) and left upper extremity numbness.  He reports that these episodes of numbness happen without any provoking or alleviating events.  They can last seconds to hours and resolve without intervention.  No motor weakness, no involvement of the left lower extremity, no gait disturbances, no speech disturbances, no facial droop.  He has no numbness at this time.  He denies any chest pain or shortness of breath.  Denies any family history of stroke.  Patient is a smoker.   Past Medical History:  Diagnosis Date  . Anxiety   . Anxiety   . Family history of colon cancer   . Family history of leukemia   . Family history of Lynch syndrome   . Family history of prostate cancer     Patient Active Problem List   Diagnosis Date Noted  . Fever 11/08/2020  . Acute serous otitis media 08/31/2020  . Acute mucoid otitis media of both ears 08/31/2020  . Otitis media 08/31/2020  . Mixed hyperlipidemia 02/06/2020  . Other fatigue 02/06/2020  . Acute conjunctivitis of left eye 12/31/2019  . Genetic testing 12/15/2019  . Family history of colon cancer   . Family history of prostate cancer   .  Family history of Lynch syndrome   . Family history of leukemia   . Acute recurrent sinusitis 09/09/2019  . Allergic rhinitis due to pollen 07/03/2019  . Contact dermatitis and eczema due to plant 05/29/2019  . Vitamin D deficiency 03/01/2019  . Flu-like symptoms 03/05/2018  . Upper respiratory tract infection 03/05/2018  . Wheezing 03/05/2018  . Anxiety 03/03/2018  . GERD (gastroesophageal reflux disease) 03/03/2018  . Tobacco use 03/03/2018    History reviewed. No pertinent surgical history.  Prior to Admission medications   Medication Sig Start Date End Date Taking? Authorizing Provider  ondansetron (ZOFRAN ODT) 4 MG disintegrating tablet Take 1 tablet (4 mg total) by mouth every 8 (eight) hours as needed. 11/18/20  Yes Alfred Levins, [place], MD  albuterol (VENTOLIN HFA) 108 (90 Base) MCG/ACT inhaler Inhale 2 puffs into the lungs every 6 (six) hours as needed for wheezing or shortness of breath. 11/10/20   Lavera Guise, MD  ALPRAZolam Duanne Moron) 0.25 MG tablet Take 1 tablet (0.25 mg total) by mouth 2 (two) times daily as needed for anxiety. 11/17/20   Lavera Guise, MD  cetirizine (ZYRTEC) 10 MG tablet Take 10 mg by mouth daily.    [provider]  citalopram (CELEXA) 20 MG tablet Take one tab at night 11/17/20   Lavera Guise, MD  fluticasone [facility]) 50 MCG/ACT nasal spray Place 2 sprays into both nostrils 2 (two) times a day. 05/15/19   Kendell Bane, NP  omeprazole ([place])  10 MG capsule Take 10 mg by mouth daily.    [provider]  pantoprazole (PROTONIX) 40 MG tablet Take 1 tablet (40 mg total) by mouth daily. 08/17/20 08/29/20  Ronnell Freshwater, NP    Allergies Penicillins  Family History  Problem Relation Age of Onset  . Healthy Mother   . Colon cancer Father        MSH2+ Lynch syndrome, CHEK2+  . Colon cancer Paternal Uncle 33  . Colon cancer Paternal Grandfather        "Lynch+"  . Prostate cancer Paternal Grandfather        dx 40s  . Leukemia  Half-Brother 16  . Colon cancer Half-Brother 76       "Lynch+"    Social History Social History   Tobacco Use  . Smoking status: Former Smoker    Packs/day: 1.00    Years: 10.00    Pack years: 10.00    Types: Cigarettes    Quit date: 11/28/2016    Years since quitting: 3.9  . Smokeless tobacco: Never Used  Vaping Use  . Vaping Use: Never used  Substance Use Topics  . Alcohol use: No  . Drug use: No    Review of Systems  Constitutional: Negative for fever. + body aches, chills, lightheadedness Eyes: Negative for visual changes. ENT: Negative for sore throat. Neck: No neck pain  Cardiovascular: Negative for chest pain. Respiratory: Negative for shortness of breath. Gastrointestinal: Negative for abdominal pain, vomiting or diarrhea. + nausea and dry heaving Genitourinary: Negative for dysuria. Musculoskeletal: Negative for back pain. Skin: Negative for rash. Neurological: Negative for headaches, weakness or numbness. + intermittent L lower facial numbness and LUE numbness Psych: No SI or HI  ____________________________________________   PHYSICAL EXAM:  VITAL SIGNS: ED Triage Vitals  Enc Vitals Group     BP 11/17/20 1846 121/83     Pulse Rate 11/17/20 1846 78     Resp 11/17/20 1846 16     Temp 11/17/20 1846 97.8 F (36.6 C)     Temp Source 11/17/20 1846 Oral     SpO2 11/17/20 1846 98 %     Weight 11/17/20 1846 224 lb 12.8 oz (102 kg)     Height 11/17/20 1846 _0  (1.753 m)     Head Circumference --      Peak Flow --      Pain Score 11/17/20 1845 6     Pain Loc --      Pain Edu? --      Excl. in [place]? --     Constitutional: Alert and oriented. Well appearing and in no apparent distress. HEENT:      Head: Normocephalic and atraumatic.         Eyes: Conjunctivae are normal. Sclera is non-icteric.       Mouth/Throat: Mucous membranes are moist.       Neck: Supple with no signs of meningismus. Cardiovascular: Regular rate and rhythm. No murmurs, gallops,  or rubs. 2+ symmetrical distal pulses are present in all extremities. No JVD. Respiratory: Normal respiratory effort. Lungs are clear to auscultation bilaterally. Gastrointestinal: Soft, non tender, and non distended. Musculoskeletal: No edema, cyanosis, or erythema of extremities. Neurologic: Normal speech and language. Face is symmetric. EOMI, PERRL, No dysmetria or pronator drift, normal gait, strength and sensation x 4 normal Skin: Skin is warm, dry and intact. No rash noted. Psychiatric: Mood and affect are normal. Speech and behavior are normal.  ____________________________________________   LABS (all  labs ordered are listed, but only abnormal results are displayed)  Labs Reviewed  BASIC METABOLIC PANEL - Abnormal; Notable for the following components:      Result Value   Glucose, Bld 107 (*)    All other components within normal limits  CBC - Abnormal; Notable for the following components:   WBC 11.5 (*)    MCHC 36.4 (*)    All other components within normal limits  TROPONIN I (HIGH SENSITIVITY)  TROPONIN I (HIGH SENSITIVITY)   ____________________________________________  EKG  ED ECG REPORT I, Rudene Re, the attending physician, personally viewed and interpreted this ECG.  Normal sinus rhythm, normal rate, normal intervals, normal axis, no ST elevations or depressions.  Unchanged from prior from 2014 ____________________________________________  RADIOLOGY  I have personally reviewed the images performed during this visit and I agree with the Radiologist's read.   Interpretation by Radiologist:  DG Chest 2 View  Result Date: 11/17/2020 CLINICAL DATA:  Chest pain EXAM: CHEST - 2 VIEW COMPARISON:  November 10, 2020 FINDINGS: The heart size and mediastinal contours are within normal limits. Both lungs are clear. The visualized skeletal structures are unremarkable. IMPRESSION: No active cardiopulmonary disease. Electronically Signed   By: Prudencio Pair M.D.   On:  11/17/2020 19:22   CT Head Wo Contrast  Result Date: 11/18/2020 CLINICAL DATA:  Lightheadedness inter intermittent left lower facial numbness. Recent COVID infection. EXAM: CT HEAD WITHOUT CONTRAST TECHNIQUE: Contiguous axial images were obtained from the base of the skull through the vertex without intravenous contrast. COMPARISON:  None. FINDINGS: Brain: No evidence of acute infarction, hemorrhage, hydrocephalus, extra-axial collection or mass lesion/mass effect. Vascular: No hyperdense vessel or unexpected calcification. Skull: Normal. Negative for fracture or focal lesion. Sinuses/Orbits: Overall mild mucosal thickening in paranasal sinuses and nasal cavity. No fluid level or focal sinus inflammation. Negative orbits. IMPRESSION: 1. Normal appearance of the brain. 2. Mild sinus disease. Electronically Signed   By: Monte Fantasia M.D.   On: 11/18/2020 05:58     ____________________________________________   PROCEDURES  Procedure(s) performed: None Procedures Critical Care performed:  None ____________________________________________   INITIAL IMPRESSION / ASSESSMENT AND PLAN / ED COURSE  30 y.o. male with a history of anxiety, GERD, smoking, hyperlipidemia and recently diagnosed with COVID a week ago who presents for evaluation of nausea, dry heaving, body aches, chills, intermittent lightheadedness, intermittent left lower face and left upper extremity numbness.  Patient is extremely well-appearing in no distress with normal vital signs, has strong equal pulses in all four extremities.  Completely intact neurological exam.  No signs of respiratory distress with normal sats both at rest and with ambulation.    Ddx Covid, electrolyte derangements, dehydration, CVA.   EKG with no signs of dysrhythmias.  Labs with no electrolyte derangements, normal troponin x2, mild leukocytosis with white count of 11.5.  Most likely from [place].  Chest x-ray visualized by me with no acute findings,  confirmed by radiology. Head CT with no acute findings, visualized by me confirmed by radiology.  Patient was discharged home with a prescription for Zofran, recommended increase fluid hydration, Tylenol and ibuprofen for body aches and chills and close follow-up with PCP.  Discussed my standard return precautions           _____________________________________________ Please note:  Patient was evaluated in Emergency Department today for the symptoms described in the history of present illness. Patient was evaluated in the context of the global COVID-19 pandemic, which necessitated consideration that the patient might  be at risk for infection with the SARS-CoV-2 virus that causes COVID-19. Institutional protocols and algorithms that pertain to the evaluation of patients at risk for COVID-19 are in a state of rapid change based on information released by regulatory bodies including the CDC and federal and state organizations. These policies and algorithms were followed during the patient's care in the ED.  Some ED evaluations and interventions may be delayed as a result of limited staffing during the pandemic.   [place] Controlled Substance Database was reviewed by me. ____________________________________________   FINAL CLINICAL IMPRESSION(S) / ED DIAGNOSES   Final diagnoses:  COVID-19      NEW MEDICATIONS STARTED DURING THIS VISIT:  ED Discharge Orders         Ordered    ondansetron (ZOFRAN ODT) 4 MG disintegrating tablet  Every 8 hours PRN        11/18/20 0622           Note:  This document was prepared using Dragon voice recognition software and may include unintentional dictation errors.   Alfred Levins, [place], MD 11/18/20 [phone]

## 2020-11-18 NOTE — ED Notes (Signed)
MD at bedside to update patient

## 2020-11-18 NOTE — ED Notes (Signed)
Pt to CT

## 2020-12-13 ENCOUNTER — Encounter: Payer: Managed Care, Other (non HMO) | Admitting: Hospice and Palliative Medicine

## 2020-12-15 ENCOUNTER — Encounter: Payer: Self-pay | Admitting: Physician Assistant

## 2020-12-15 ENCOUNTER — Ambulatory Visit: Payer: Managed Care, Other (non HMO) | Admitting: Physician Assistant

## 2020-12-15 ENCOUNTER — Other Ambulatory Visit: Payer: Self-pay

## 2020-12-15 DIAGNOSIS — F411 Generalized anxiety disorder: Secondary | ICD-10-CM | POA: Diagnosis not present

## 2020-12-15 DIAGNOSIS — R300 Dysuria: Secondary | ICD-10-CM

## 2020-12-15 DIAGNOSIS — F19982 Other psychoactive substance use, unspecified with psychoactive substance-induced sleep disorder: Secondary | ICD-10-CM

## 2020-12-15 DIAGNOSIS — Z0001 Encounter for general adult medical examination with abnormal findings: Secondary | ICD-10-CM | POA: Diagnosis not present

## 2020-12-15 DIAGNOSIS — K219 Gastro-esophageal reflux disease without esophagitis: Secondary | ICD-10-CM | POA: Diagnosis not present

## 2020-12-15 DIAGNOSIS — E669 Obesity, unspecified: Secondary | ICD-10-CM

## 2020-12-15 MED ORDER — OMEPRAZOLE 40 MG PO CPDR: 40.0000 mg | DELAYED_RELEASE_CAPSULE | Freq: Every day | ORAL | 1 refills | Status: DC

## 2020-12-15 NOTE — Progress Notes (Signed)
[facility] [place], [place] [address]  Internal MEDICINE  Office Visit Note  Patient Name: Alfred Gross  [id number]  [id number]  Date of Service: 12/18/2020  Chief Complaint  Patient presents with  . Annual Exam  . Follow-up    4 month    HPI Pt is here for routine health maintenance examination -He had been sick with covid in Jan that was treated with Azithromycin and prednisone, but he also began to experience difficulty sleeping due to rising anxiety. He was also experiencing numbness on his left lower face and LUE for which he was seen in the ED. An EKG was found to be normal. CT head and CXR were done and were negative for any acute findings. He reports the numbness has completely resolved now that he is fully recovered from covid. -Anxiety well controlled on Celexa. No longer taking xanax since covid over. -GERD is still problematic due to insurance not covering omeprazole. Changed insurance will try to send again. -He is very active and has lost some weight. He plans to continue to lose weight via diet and exercise.  EKG 11/17/20 Normal sinus rhythm, normal rate, normal intervals, normal axis, no ST elevations or depressions.  Unchanged from prior from 2014  DG Chest 2 View  Result Date: 11/17/2020 CLINICAL DATA:  Chest pain EXAM: CHEST - 2 VIEW COMPARISON:  November 10, 2020 FINDINGS: The heart size and mediastinal contours are within normal limits. Both lungs are clear. The visualized skeletal structures are unremarkable. IMPRESSION: No active cardiopulmonary disease. Electronically Signed   By: Prudencio Pair M.D.   On: 11/17/2020 19:22   CT Head Wo Contrast  Result Date: 11/18/2020 CLINICAL DATA:  Lightheadedness inter intermittent left lower facial numbness. Recent COVID infection. EXAM: CT HEAD WITHOUT CONTRAST TECHNIQUE: Contiguous axial images were obtained from the base of the skull through the vertex without intravenous contrast.  COMPARISON:  None. FINDINGS: Brain: No evidence of acute infarction, hemorrhage, hydrocephalus, extra-axial collection or mass lesion/mass effect. Vascular: No hyperdense vessel or unexpected calcification. Skull: Normal. Negative for fracture or focal lesion. Sinuses/Orbits: Overall mild mucosal thickening in paranasal sinuses and nasal cavity. No fluid level or focal sinus inflammation. Negative orbits. IMPRESSION: 1. Normal appearance of the brain. 2. Mild sinus disease. Electronically Signed   By: Monte Fantasia M.D.   On: 11/18/2020 05:58   Current Medication: Outpatient Encounter Medications as of 12/15/2020  Medication Sig  . albuterol (VENTOLIN HFA) 108 (90 Base) MCG/ACT inhaler Inhale 2 puffs into the lungs every 6 (six) hours as needed for wheezing or shortness of breath.  . cetirizine (ZYRTEC) 10 MG tablet Take 10 mg by mouth daily.  . citalopram (CELEXA) 20 MG tablet Take one tab at night  . fluticasone (FLONASE) 50 MCG/ACT nasal spray Place 2 sprays into both nostrils 2 (two) times a day.  Marland Kitchen omeprazole (PRILOSEC) 40 MG capsule Take 1 capsule (40 mg total) by mouth daily.  . [DISCONTINUED] omeprazole (PRILOSEC) 10 MG capsule Take 10 mg by mouth daily.  Marland Kitchen ALPRAZolam (XANAX) 0.25 MG tablet Take 1 tablet (0.25 mg total) by mouth 2 (two) times daily as needed for anxiety. (Patient not taking: Reported on 12/15/2020)  . [DISCONTINUED] ondansetron (ZOFRAN ODT) 4 MG disintegrating tablet Take 1 tablet (4 mg total) by mouth every 8 (eight) hours as needed. (Patient not taking: Reported on 12/15/2020)  . [DISCONTINUED] pantoprazole (PROTONIX) 40 MG tablet Take 1 tablet (40 mg total) by mouth daily.   No facility-administered  encounter medications on file as of 12/15/2020.    Surgical History: Past Surgical History:  Procedure Laterality Date  . no surgical history      Medical History: Past Medical History:  Diagnosis Date  . Anxiety   . Anxiety   . Family history of colon cancer   .  Family history of leukemia   . Family history of Lynch syndrome   . Family history of prostate cancer     Family History: Family History  Problem Relation Age of Onset  . Healthy Mother   . Colon cancer Father        MSH2+ Lynch syndrome, CHEK2+  . Colon cancer Paternal Uncle 46  . Colon cancer Paternal Grandfather        "Lynch+"  . Prostate cancer Paternal Grandfather        dx 41s  . Leukemia Half-Brother 16  . Colon cancer Half-Brother 31       "Lynch+"      Review of Systems  Constitutional: Negative for chills, fatigue and unexpected weight change.  HENT: Negative for congestion, postnasal drip, rhinorrhea, sneezing and sore throat.   Eyes: Negative for redness.  Respiratory: Negative for cough, chest tightness and shortness of breath.   Cardiovascular: Negative for chest pain and palpitations.  Gastrointestinal: Negative for abdominal pain, constipation, diarrhea, nausea and vomiting.  Genitourinary: Negative for dysuria and frequency.  Musculoskeletal: Negative for arthralgias, back pain, joint swelling and neck pain.  Skin: Negative for rash.  Neurological: Negative.  Negative for dizziness, tremors, light-headedness, numbness and headaches.  Hematological: Negative for adenopathy. Does not bruise/bleed easily.  Psychiatric/Behavioral: Negative for behavioral problems (Depression), sleep disturbance and suicidal ideas. The patient is not nervous/anxious.      Vital Signs: BP 116/78   Pulse 92   Temp 98.6 F (37 C)   Resp 16   Ht 5' 9"  (1.753 m)   Wt 232 lb 9.6 oz (105.5 kg)   SpO2 96%   BMI 34.35 kg/m    Physical Exam Constitutional:      General: He is not in acute distress.    Appearance: He is well-developed. He is obese. He is not diaphoretic.  HENT:     Head: Normocephalic and atraumatic.     Right Ear: External ear normal.     Left Ear: External ear normal.     Nose: Nose normal.     Mouth/Throat:     Pharynx: No oropharyngeal exudate.   Eyes:     General: No scleral icterus.       Right eye: No discharge.        Left eye: No discharge.     Conjunctiva/sclera: Conjunctivae normal.     Pupils: Pupils are equal, round, and reactive to light.  Neck:     Thyroid: No thyromegaly.     Vascular: No JVD.     Trachea: No tracheal deviation.  Cardiovascular:     Rate and Rhythm: Normal rate and regular rhythm.     Heart sounds: Normal heart sounds. No murmur heard. No friction rub. No gallop.   Pulmonary:     Effort: Pulmonary effort is normal. No respiratory distress.     Breath sounds: Normal breath sounds. No stridor. No wheezing or rales.  Chest:     Chest wall: No tenderness.  Abdominal:     General: Bowel sounds are normal. There is no distension.     Palpations: Abdomen is soft. There is no mass.  Tenderness: There is no abdominal tenderness. There is no guarding or rebound.  Musculoskeletal:        General: No tenderness or deformity. Normal range of motion.     Cervical back: Normal range of motion and neck supple.  Lymphadenopathy:     Cervical: No cervical adenopathy.  Skin:    General: Skin is warm and dry.     Coloration: Skin is not pale.     Findings: No erythema or rash.  Neurological:     Mental Status: He is alert.     Cranial Nerves: No cranial nerve deficit.     Motor: No abnormal muscle tone.     Coordination: Coordination normal.     Deep Tendon Reflexes: Reflexes are normal and symmetric.  Psychiatric:        Behavior: Behavior normal.        Thought Content: Thought content normal.        Judgment: Judgment normal.      LABS: Recent Results (from the past 2160 hour(s))  Basic metabolic panel     Status: Abnormal   Collection Time: 11/17/20  7:07 PM  Result Value Ref Range   Sodium 138 135 - 145 mmol/L   Potassium 3.9 3.5 - 5.1 mmol/L   Chloride 104 98 - 111 mmol/L   CO2 23 22 - 32 mmol/L   Glucose, Bld 107 (H) 70 - 99 mg/dL    Comment: Glucose reference range applies only to  samples taken after fasting for at least 8 hours.   BUN 12 6 - 20 mg/dL   Creatinine, Ser 0.71 0.61 - 1.24 mg/dL   Calcium 9.5 8.9 - 10.3 mg/dL   GFR, Estimated >60 >60 mL/min    Comment: (NOTE) Calculated using the CKD-EPI Creatinine Equation (2021)    Anion gap 11 5 - 15    Comment: Performed at [facility], [address] [place], [place] [address]  CBC     Status: Abnormal   Collection Time: 11/17/20  7:07 PM  Result Value Ref Range   WBC 11.5 (H) 4.0 - 10.5 K/uL   RBC 5.35 4.22 - 5.81 MIL/uL   Hemoglobin 15.8 13.0 - 17.0 g/dL   HCT 43.4 39.0 - 52.0 %   MCV 81.1 80.0 - 100.0 fL   MCH 29.5 26.0 - 34.0 pg   MCHC 36.4 (H) 30.0 - 36.0 g/dL   RDW 11.6 11.5 - 15.5 %   Platelets 313 150 - 400 K/uL   nRBC 0.0 0.0 - 0.2 %    Comment: Performed at [facility], [place], [place] [address]  Troponin I (High Sensitivity)     Status: None   Collection Time: 11/17/20  7:07 PM  Result Value Ref Range   Troponin I (High Sensitivity) 3 <18 ng/L    Comment: (NOTE) Elevated high sensitivity troponin I (hsTnI) values and significant  changes across serial measurements may suggest ACS but many other  chronic and acute conditions are known to elevate hsTnI results.  Refer to the "Links" section for chest pain algorithms and additional  guidance. Performed at [facility] [place], [place], [place] [address]   Troponin I (High Sensitivity)     Status: None   Collection Time: 11/17/20 10:29 PM  Result Value Ref Range   Troponin I (High Sensitivity) 2 <18 ng/L    Comment: (NOTE) Elevated high sensitivity troponin I (hsTnI) values and significant  changes across serial measurements may suggest  ACS but many other  chronic and acute conditions are known to elevate hsTnI results.  Refer to the "Links" section for chest pain algorithms and additional  guidance. Performed at [facility], [address] [place], [place] [address]    UA/M w/rflx Culture, Routine     Status: Abnormal   Collection Time: 12/15/20  4:22 PM   Specimen: Urine   Urine  Result Value Ref Range   Specific Gravity, UA 1.028 1.005 - 1.030   pH, UA 5.5 5.0 - 7.5   Color, UA Yellow Yellow   Appearance Ur Clear Clear   Leukocytes,UA Negative Negative   Protein,UA Negative Negative/Trace   Glucose, UA Negative Negative   Ketones, UA Trace (A) Negative   RBC, UA Negative Negative   Bilirubin, UA Negative Negative   Urobilinogen, Ur 0.2 0.2 - 1.0 mg/dL   Nitrite, UA Negative Negative   Microscopic Examination Comment     Comment: Microscopic follows if indicated.   Microscopic Examination See below:     Comment: Microscopic was indicated and was performed.   Urinalysis Reflex Comment     Comment: This specimen will not reflex to a Urine Culture.  Microscopic Examination     Status: Abnormal   Collection Time: 12/15/20  4:22 PM   Urine  Result Value Ref Range   WBC, UA None seen 0 - 5 /hpf   RBC None seen 0 - 2 /hpf   Epithelial Cells (non renal) 0-10 0 - 10 /hpf   Casts None seen None seen /lpf   Crystals Present (A) N/A   Crystal Type Uric Acid N/A   Bacteria, UA None seen None seen/Few    .MAMM    Assessment/Plan: 1. Encounter for general adult medical examination with abnormal findings Lab slip given for age appropriate fasting labs,  2. Gastroesophageal reflux disease without esophagitis - omeprazole (PRILOSEC) 40 MG capsule; Take 1 capsule (40 mg total) by mouth daily.  Dispense: 90 capsule; Refill: 1  3. GAD (generalized anxiety disorder) Continue celexa daily.  4. Insomnia due to drug [facility]) Sleeping well now since covid resolved. No longer taking Xanax.  5. Obesity (BMI 30.0-34.9) Discussed the importance of weight management through healthy eating and daily exercise as tolerated. Discussed the negative effects obesity has on pulmonary health, cardiac health as well as overall general health and well being.  6.  Dysuria - UA/M w/rflx Culture, Routine   General Counseling: Sy verbalizes understanding of the findings of todays visit and agrees with plan of treatment. I have discussed any further diagnostic evaluation that may be needed or ordered today. We also reviewed his medications today. he has been encouraged to call the office with any questions or concerns that should arise related to todays visit.    Counseling:    Orders Placed This Encounter  Procedures  . Microscopic Examination  . UA/M w/rflx Culture, Routine    Meds ordered this encounter  Medications  . omeprazole (PRILOSEC) 40 MG capsule    Sig: Take 1 capsule (40 mg total) by mouth daily.    Dispense:  90 capsule    Refill:  1    Total time spent:30 Minutes  Time spent includes review of chart, medications, test results, and follow up plan with the patient.     Lavera Guise, MD  Internal Medicine

## 2020-12-18 LAB — UA/M W/RFLX CULTURE, ROUTINE
Bilirubin, UA: NEGATIVE
Glucose, UA: NEGATIVE
Leukocytes,UA: NEGATIVE
Nitrite, UA: NEGATIVE
Protein,UA: NEGATIVE
RBC, UA: NEGATIVE
Specific Gravity, UA: 1.028 (ref 1.005–1.030)
Urobilinogen, Ur: 0.2 mg/dL (ref 0.2–1.0)
pH, UA: 5.5 (ref 5.0–7.5)

## 2020-12-18 LAB — MICROSCOPIC EXAMINATION
Bacteria, UA: NONE SEEN
Casts: NONE SEEN /lpf
RBC, Urine: NONE SEEN /hpf (ref 0–2)
WBC, UA: NONE SEEN /hpf (ref 0–5)

## 2021-02-15 ENCOUNTER — Other Ambulatory Visit: Payer: Self-pay | Admitting: Physician Assistant

## 2021-02-16 LAB — CBC WITH DIFFERENTIAL/PLATELET
Basophils Absolute: 0.1 10*3/uL (ref 0.0–0.2)
Basos: 1 %
EOS (ABSOLUTE): 0.5 10*3/uL — ABNORMAL HIGH (ref 0.0–0.4)
Eos: 6 %
Hematocrit: 46.3 % (ref 37.5–51.0)
Hemoglobin: 15.9 g/dL (ref 13.0–17.7)
Immature Grans (Abs): 0 10*3/uL (ref 0.0–0.1)
Immature Granulocytes: 0 %
Lymphocytes Absolute: 2.1 10*3/uL (ref 0.7–3.1)
Lymphs: 24 %
MCH: 29.6 pg (ref 26.6–33.0)
MCHC: 34.3 g/dL (ref 31.5–35.7)
MCV: 86 fL (ref 79–97)
Monocytes Absolute: 0.6 10*3/uL (ref 0.1–0.9)
Monocytes: 7 %
Neutrophils Absolute: 5.2 10*3/uL (ref 1.4–7.0)
Neutrophils: 62 %
Platelets: 244 10*3/uL (ref 150–450)
RBC: 5.38 x10E6/uL (ref 4.14–5.80)
RDW: 11.9 % (ref 11.6–15.4)
WBC: 8.5 10*3/uL (ref 3.4–10.8)

## 2021-02-16 LAB — COMPREHENSIVE METABOLIC PANEL
ALT: 21 IU/L (ref 0–44)
AST: 13 IU/L (ref 0–40)
Albumin/Globulin Ratio: 1.8 (ref 1.2–2.2)
Albumin: 4.6 g/dL (ref 4.0–5.0)
Alkaline Phosphatase: 72 IU/L (ref 44–121)
BUN/Creatinine Ratio: 14 (ref 9–20)
BUN: 12 mg/dL (ref 6–20)
Bilirubin Total: 0.7 mg/dL (ref 0.0–1.2)
CO2: 21 mmol/L (ref 20–29)
Calcium: 9.5 mg/dL (ref 8.7–10.2)
Chloride: 102 mmol/L (ref 96–106)
Creatinine, Ser: 0.85 mg/dL (ref 0.76–1.27)
Globulin, Total: 2.6 g/dL (ref 1.5–4.5)
Glucose: 99 mg/dL (ref 65–99)
Potassium: 4.3 mmol/L (ref 3.5–5.2)
Sodium: 139 mmol/L (ref 134–144)
Total Protein: 7.2 g/dL (ref 6.0–8.5)
eGFR: 119 mL/min/{1.73_m2} (ref >59)

## 2021-02-16 LAB — SPECIMEN STATUS REPORT

## 2021-02-16 LAB — TSH: TSH: 1.49 u[IU]/mL (ref 0.450–4.500)

## 2021-02-16 LAB — LIPID PANEL W/O CHOL/HDL RATIO
Cholesterol, Total: 203 mg/dL — ABNORMAL HIGH (ref 100–199)
HDL: 31 mg/dL — ABNORMAL LOW (ref >39)
LDL Chol Calc (NIH): 127 mg/dL — ABNORMAL HIGH (ref 0–99)
Triglycerides: 253 mg/dL — ABNORMAL HIGH (ref 0–149)
VLDL Cholesterol Cal: 45 mg/dL — ABNORMAL HIGH (ref 5–40)

## 2021-02-16 LAB — T4, FREE: Free T4: 1.14 ng/dL (ref 0.82–1.77)

## 2021-04-05 ENCOUNTER — Encounter: Payer: Self-pay | Admitting: Nurse Practitioner

## 2021-04-05 ENCOUNTER — Ambulatory Visit: Payer: Managed Care, Other (non HMO) | Admitting: Internal Medicine

## 2021-04-05 VITALS — Temp 97.9°F | Ht 69.0 in | Wt 226.0 lb

## 2021-04-05 DIAGNOSIS — J0100 Acute maxillary sinusitis, unspecified: Secondary | ICD-10-CM | POA: Diagnosis not present

## 2021-04-05 DIAGNOSIS — J45991 Cough variant asthma: Secondary | ICD-10-CM | POA: Diagnosis not present

## 2021-04-05 MED ORDER — SULFAMETHOXAZOLE-TRIMETHOPRIM 400-80 MG PO TABS: 1.0000 | ORAL_TABLET | Freq: Two times a day (BID) | ORAL | 0 refills | Status: DC

## 2021-04-05 NOTE — Progress Notes (Signed)
[facility] [place], [place] [address]  Internal MEDICINE  Telephone Visit  Patient Name: Alfred Gross  [id number]  [id number]  Date of Service: 04/19/2021  I connected with the patient at 1100 am  by telephone and verified the patients identity using two identifiers.   I discussed the limitations, risks, security and privacy concerns of performing an evaluation and management service by telephone and the availability of in person appointments. I also discussed with the patient that there may be a patient responsible charge related to the service.  The patient expressed understanding and agrees to proceed.    Chief Complaint  Patient presents with   Telephone Assessment    [phone]   Telephone Screen    Going on last 3 days    Sinusitis   Cough    Yellow-greenish little blood    Headache    HPI Patient seen for acute and sick visit he has been having cough sinus congestion and sinus headaches and now coughing up yellow greenish sputum, he has been tested negative for COVID Feels a little tired but denies any fever or shortness of breath This is a virtual visit  Current Medication: Outpatient Encounter Medications as of 04/05/2021  Medication Sig   albuterol (VENTOLIN HFA) 108 (90 Base) MCG/ACT inhaler Inhale 2 puffs into the lungs every 6 (six) hours as needed for wheezing or shortness of breath.   cetirizine (ZYRTEC) 10 MG tablet Take 10 mg by mouth daily.   citalopram (CELEXA) 20 MG tablet Take one tab at night   fluticasone (FLONASE) 50 MCG/ACT nasal spray Place 2 sprays into both nostrils 2 (two) times a day.   omeprazole (PRILOSEC) 40 MG capsule Take 1 capsule (40 mg total) by mouth daily.   sulfamethoxazole-trimethoprim (BACTRIM) 400-80 MG tablet Take 1 tablet by mouth 2 (two) times daily.   [DISCONTINUED] ALPRAZolam (XANAX) 0.25 MG tablet Take 1 tablet (0.25 mg total) by mouth 2 (two) times daily as needed for anxiety. (Patient not  taking: No sig reported)   [DISCONTINUED] pantoprazole (PROTONIX) 40 MG tablet Take 1 tablet (40 mg total) by mouth daily.   No facility-administered encounter medications on file as of 04/05/2021.    Surgical History: Past Surgical History:  Procedure Laterality Date   no surgical history      Medical History: Past Medical History:  Diagnosis Date   Anxiety    Anxiety    Family history of colon cancer    Family history of leukemia    Family history of Lynch syndrome    Family history of prostate cancer     Family History: Family History  Problem Relation Age of Onset   Healthy Mother    Colon cancer Father        MSH2+ Lynch syndrome, CHEK2+   Colon cancer Paternal Uncle 71   Colon cancer Paternal Grandfather        "Lynch+"   Prostate cancer Paternal Grandfather        dx 73s   Leukemia Half-Brother 79   Colon cancer Half-Brother 29       "Lynch+"    Social History   Socioeconomic History   Marital status: Married    Spouse name: Not on file   Number of children: Not on file   Years of education: Not on file   Highest education level: Not on file  Occupational History   Not on file  Tobacco Use   Smoking status: Every Day  Packs/day: 1.00    Years: 10.00    Pack years: 10.00    Types: Cigarettes    Last attempt to quit: 11/28/2016    Years since quitting: 4.3   Smokeless tobacco: Never   Tobacco comments:    1/2 pack a day   Vaping Use   Vaping Use: Never used  Substance and Sexual Activity   Alcohol use: No   Drug use: No   Sexual activity: Not on file  Other Topics Concern   Not on file  Social History Narrative   Not on file   Social Determinants of Health   Financial Resource Strain: Not on file  Food Insecurity: Not on file  Transportation Needs: Not on file  Physical Activity: Not on file  Stress: Not on file  Social Connections: Not on file  Intimate Partner Violence: Not on file      Review of Systems  Constitutional:   Negative for fatigue and fever.  HENT:  Positive for rhinorrhea, sinus pressure and sinus pain. Negative for congestion, mouth sores and postnasal drip.   Respiratory:  Positive for cough.   Cardiovascular:  Negative for chest pain.  Genitourinary:  Negative for flank pain.  Psychiatric/Behavioral: Negative.     Vital Signs: Temp 97.9 F (36.6 C)   Ht _0  (1.753 m)   Wt 226 lb (102.5 kg)   BMI 33.37 kg/m    Observation/Objective: Patient sounds congested over the phone however in no apparent distress    Assessment/Plan: 1. Acute non-recurrent maxillary sinusitis Patient instructed to use Flonase over-the-counter 2 squirts in each nostril twice a day and take Bactrim as prescribed today - sulfamethoxazole-trimethoprim (BACTRIM) 400-80 MG tablet; Take 1 tablet by mouth 2 (two) times daily.  Dispense: 20 tablet; Refill: 0  2. Cough variant asthma Take over-the-counter Delsym if needed will call the office might need bronchodilator like albuterol if no improvement in cough seen  General Counseling: Alfred Gross verbalizes understanding of the findings of today's phone visit and agrees with plan of treatment. I have discussed any further diagnostic evaluation that may be needed or ordered today. We also reviewed his medications today. he has been encouraged to call the office with any questions or concerns that should arise related to todays visit.    No orders of the defined types were placed in this encounter.   Meds ordered this encounter  Medications   sulfamethoxazole-trimethoprim (BACTRIM) 400-80 MG tablet    Sig: Take 1 tablet by mouth 2 (two) times daily.    Dispense:  20 tablet    Refill:  0    Time spent:15 Minutes    Dr Lavera Guise Internal medicine

## 2021-04-13 ENCOUNTER — Ambulatory Visit: Payer: Managed Care, Other (non HMO) | Admitting: Nurse Practitioner

## 2021-04-25 ENCOUNTER — Ambulatory Visit: Payer: Managed Care, Other (non HMO) | Admitting: Nurse Practitioner

## 2021-05-10 ENCOUNTER — Ambulatory Visit: Payer: Managed Care, Other (non HMO) | Admitting: Nurse Practitioner

## 2021-05-26 ENCOUNTER — Telehealth: Payer: Self-pay

## 2021-05-26 NOTE — Telephone Encounter (Signed)
Left vm for 05/29/21 appointment screening-Toni 

## 2021-05-29 ENCOUNTER — Other Ambulatory Visit: Payer: Self-pay

## 2021-05-29 ENCOUNTER — Encounter: Payer: Self-pay | Admitting: Nurse Practitioner

## 2021-05-29 ENCOUNTER — Ambulatory Visit: Payer: Managed Care, Other (non HMO) | Admitting: Nurse Practitioner

## 2021-05-29 DIAGNOSIS — R7301 Impaired fasting glucose: Secondary | ICD-10-CM

## 2021-05-29 DIAGNOSIS — K219 Gastro-esophageal reflux disease without esophagitis: Secondary | ICD-10-CM | POA: Diagnosis not present

## 2021-05-29 DIAGNOSIS — F411 Generalized anxiety disorder: Secondary | ICD-10-CM

## 2021-05-29 LAB — POCT GLYCOSYLATED HEMOGLOBIN (HGB A1C): Hemoglobin A1C: 5.5 % (ref 4.0–5.6)

## 2021-05-29 MED ORDER — PANTOPRAZOLE SODIUM 40 MG PO TBEC: 40.0000 mg | DELAYED_RELEASE_TABLET | Freq: Every day | ORAL | 2 refills | Status: DC

## 2021-05-29 MED ORDER — CITALOPRAM HYDROBROMIDE 20 MG PO TABS: ORAL_TABLET | ORAL | 1 refills | Status: DC

## 2021-05-29 NOTE — Progress Notes (Signed)
[facility] [place], [place] [address]  Internal MEDICINE  Office Visit Note  Patient Name: Alfred Gross  [id number]  [id number]  Date of Service: 05/29/2021  Chief Complaint  Patient presents with   Follow-up    HPI Alfred Gross presents for a follow up visit for anxiety. He has a history of anxiety. He also has a significant family history of leukemia, colon cancer and prostate cancer. It is pertinent to discuss colorectal cancer screening in 4 years at age 45.  Denies any palpitations. Anxiety has been well controlled. Sleeping on avg 6 hours oer night. -was smoking 1.5ppd, trying to quit by the end of the month so tapering. At about 1/2 ppd now. [id number] steps per day at work. Current Medication: Outpatient Encounter Medications as of 05/29/2021  Medication Sig   albuterol (VENTOLIN HFA) 108 (90 Base) MCG/ACT inhaler Inhale 2 puffs into the lungs every 6 (six) hours as needed for wheezing or shortness of breath.   cetirizine (ZYRTEC) 10 MG tablet Take 10 mg by mouth daily.   fluticasone (FLONASE) 50 MCG/ACT nasal spray Place 2 sprays into both nostrils 2 (two) times a day.   pantoprazole (PROTONIX) 40 MG tablet Take 1 tablet (40 mg total) by mouth daily.   [DISCONTINUED] citalopram (CELEXA) 20 MG tablet Take one tab at night   [DISCONTINUED] omeprazole (PRILOSEC) 40 MG capsule Take 1 capsule (40 mg total) by mouth daily.   [DISCONTINUED] sulfamethoxazole-trimethoprim (BACTRIM) 400-80 MG tablet Take 1 tablet by mouth 2 (two) times daily.   citalopram (CELEXA) 20 MG tablet Take 1 tab per night   [DISCONTINUED] pantoprazole (PROTONIX) 40 MG tablet Take 1 tablet (40 mg total) by mouth daily.   No facility-administered encounter medications on file as of 05/29/2021.    Surgical History: Past Surgical History:  Procedure Laterality Date   no surgical history      Medical History: Past Medical History:  Diagnosis Date   Anxiety    Anxiety     Family history of colon cancer    Family history of leukemia    Family history of Lynch syndrome    Family history of prostate cancer     Family History: Family History  Problem Relation Age of Onset   Healthy Mother    Colon cancer Father        MSH2+ Lynch syndrome, CHEK2+   Colon cancer Paternal Uncle 41   Colon cancer Paternal Grandfather        "Lynch+"   Prostate cancer Paternal Grandfather        dx 42s   Leukemia Half-Brother 109   Colon cancer Half-Brother 6       "Lynch+"    Social History   Socioeconomic History   Marital status: Married    Spouse name: Not on file   Number of children: Not on file   Years of education: Not on file   Highest education level: Not on file  Occupational History   Not on file  Tobacco Use   Smoking status: Every Day    Packs/day: 1.00    Years: 10.00    Pack years: 10.00    Types: Cigarettes    Last attempt to quit: 11/28/2016    Years since quitting: 4.5   Smokeless tobacco: Never   Tobacco comments:    1/2 pack a day   Vaping Use   Vaping Use: Never used  Substance and Sexual Activity   Alcohol use: No   Drug use:  No   Sexual activity: Not on file  Other Topics Concern   Not on file  Social History Narrative   Not on file   Social Determinants of Health   Financial Resource Strain: Not on file  Food Insecurity: Not on file  Transportation Needs: Not on file  Physical Activity: Not on file  Stress: Not on file  Social Connections: Not on file  Intimate Partner Violence: Not on file      Review of Systems  Constitutional:  Negative for chills, fatigue and unexpected weight change.  HENT:  Negative for congestion, rhinorrhea, sneezing and sore throat.   Eyes:  Negative for redness.  Respiratory:  Negative for cough, chest tightness and shortness of breath.   Cardiovascular:  Negative for chest pain and palpitations.  Gastrointestinal:  Negative for abdominal pain, constipation, diarrhea, nausea and  vomiting.  Genitourinary:  Negative for dysuria and frequency.  Musculoskeletal:  Negative for arthralgias, back pain, joint swelling and neck pain.  Skin:  Negative for rash.  Neurological: Negative.  Negative for tremors and numbness.  Hematological:  Negative for adenopathy. Does not bruise/bleed easily.  Psychiatric/Behavioral:  Negative for behavioral problems (Depression), sleep disturbance and suicidal ideas. The patient is not nervous/anxious.    Vital Signs: BP 114/86   Pulse 83   Temp 98.6 F (37 C)   Resp 16   Ht _0  (1.753 m)   Wt 245 lb (111.1 kg)   SpO2 96%   BMI 36.18 kg/m    Physical Exam Vitals reviewed.  Constitutional:      General: He is not in acute distress.    Appearance: Normal appearance. He is obese. He is not ill-appearing.  HENT:     Head: Normocephalic and atraumatic.  Cardiovascular:     Rate and Rhythm: Normal rate and regular rhythm.  Pulmonary:     Effort: Pulmonary effort is normal.  Skin:    General: Skin is warm and dry.     Capillary Refill: Capillary refill takes less than 2 seconds.  Neurological:     Mental Status: He is alert and oriented to person, place, and time.  Psychiatric:        Mood and Affect: Mood normal.        Behavior: Behavior normal.    Assessment/Plan: 1. GAD (generalized anxiety disorder) Stable, refill ordered.  - citalopram (CELEXA) 20 MG tablet; Take 1 tab per night  Dispense: 90 tablet; Refill: 1  2. Impaired fasting glucose Impaired fasting glucose noted on prior labs, A1C was checked today, it was normal.  - POCT glycosylated hemoglobin (Hb A1C)  3. Gastroesophageal reflux disease without esophagitis Well controlled with current medication, refill ordered.  - pantoprazole (PROTONIX) 40 MG tablet; Take 1 tablet (40 mg total) by mouth daily.  Dispense: 30 tablet; Refill: 2   General Counseling: Alfred Gross verbalizes understanding of the findings of todays visit and agrees with plan of treatment.  I have discussed any further diagnostic evaluation that may be needed or ordered today. We also reviewed his medications today. he has been encouraged to call the office with any questions or concerns that should arise related to todays visit.    Orders Placed This Encounter  Procedures   POCT glycosylated hemoglobin (Hb A1C)    Meds ordered this encounter  Medications   pantoprazole (PROTONIX) 40 MG tablet    Sig: Take 1 tablet (40 mg total) by mouth daily.    Dispense:  30 tablet    Refill:  2   citalopram (CELEXA) 20 MG tablet    Sig: Take 1 tab per night    Dispense:  90 tablet    Refill:  1    Return in about 4 months (around 09/28/2021) for F/U, med refill, Kaziyah Parkison PCP.   Total time spent:30 Minutes Time spent includes review of chart, medications, test results, and follow up plan with the patient.   [place] Controlled Substance Database was reviewed by me.  This patient was seen by Jonetta Osgood, FNP-C in collaboration with Dr. Clayborn Bigness as a part of collaborative care agreement.   Saw [place] R. Valetta Fuller, MSN, FNP-C Internal medicine

## 2021-05-29 NOTE — Progress Notes (Deleted)
[facility] [place], [place] [address]  Internal MEDICINE  Office Visit Note  Patient Name: Alfred Gross  [id number]  [id number]  Date of Service: 05/29/2021  Chief Complaint  Patient presents with   Follow-up    HPI  Denies any palpitations. Anxiety has been well controlled. Sleeping on avg 6 hours oer night. -was smoking 1.5ppd, trying to quit by the end of the month so tapering. At about 1/2 ppd now.  [id number] steps per day at work.   Current Medication: Outpatient Encounter Medications as of 05/29/2021  Medication Sig   albuterol (VENTOLIN HFA) 108 (90 Base) MCG/ACT inhaler Inhale 2 puffs into the lungs every 6 (six) hours as needed for wheezing or shortness of breath.   cetirizine (ZYRTEC) 10 MG tablet Take 10 mg by mouth daily.   citalopram (CELEXA) 20 MG tablet Take one tab at night   fluticasone (FLONASE) 50 MCG/ACT nasal spray Place 2 sprays into both nostrils 2 (two) times a day.   pantoprazole (PROTONIX) 40 MG tablet Take 1 tablet (40 mg total) by mouth daily.   [DISCONTINUED] omeprazole (PRILOSEC) 40 MG capsule Take 1 capsule (40 mg total) by mouth daily.   [DISCONTINUED] sulfamethoxazole-trimethoprim (BACTRIM) 400-80 MG tablet Take 1 tablet by mouth 2 (two) times daily.   [DISCONTINUED] pantoprazole (PROTONIX) 40 MG tablet Take 1 tablet (40 mg total) by mouth daily.   No facility-administered encounter medications on file as of 05/29/2021.    Surgical History: Past Surgical History:  Procedure Laterality Date   no surgical history      Medical History: Past Medical History:  Diagnosis Date   Anxiety    Anxiety    Family history of colon cancer    Family history of leukemia    Family history of Lynch syndrome    Family history of prostate cancer     Family History: Family History  Problem Relation Age of Onset   Healthy Mother    Colon cancer Father        MSH2+ Lynch syndrome, CHEK2+   Colon cancer Paternal Uncle 32    Colon cancer Paternal Grandfather        "Lynch+"   Prostate cancer Paternal Grandfather        dx 31s   Leukemia Half-Brother 18   Colon cancer Half-Brother 67       "Lynch+"    Social History   Socioeconomic History   Marital status: Married    Spouse name: Not on file   Number of children: Not on file   Years of education: Not on file   Highest education level: Not on file  Occupational History   Not on file  Tobacco Use   Smoking status: Every Day    Packs/day: 1.00    Years: 10.00    Pack years: 10.00    Types: Cigarettes    Last attempt to quit: 11/28/2016    Years since quitting: 4.5   Smokeless tobacco: Never   Tobacco comments:    1/2 pack a day   Vaping Use   Vaping Use: Never used  Substance and Sexual Activity   Alcohol use: No   Drug use: No   Sexual activity: Not on file  Other Topics Concern   Not on file  Social History Narrative   Not on file   Social Determinants of Health   Financial Resource Strain: Not on file  Food Insecurity: Not on file  Transportation Needs: Not on file  Physical Activity: Not on file  Stress: Not on file  Social Connections: Not on file  Intimate Partner Violence: Not on file      Review of Systems  Vital Signs: BP 114/86   Pulse 83   Temp 98.6 F (37 C)   Resp 16   Ht 5' 9"  (1.753 m)   Wt 245 lb (111.1 kg)   SpO2 96%   BMI 36.18 kg/m    Physical Exam     Assessment/Plan:   General Counseling: Mamie verbalizes understanding of the findings of todays visit and agrees with plan of treatment. I have discussed any further diagnostic evaluation that may be needed or ordered today. We also reviewed his medications today. he has been encouraged to call the office with any questions or concerns that should arise related to todays visit.    Orders Placed This Encounter  Procedures   POCT glycosylated hemoglobin (Hb A1C)    Meds ordered this encounter  Medications   pantoprazole (PROTONIX)  40 MG tablet    Sig: Take 1 tablet (40 mg total) by mouth daily.    Dispense:  30 tablet    Refill:  2    This patient was seen by Drema Dallas, PA-C in collaboration with Dr. Clayborn Bigness as a part of collaborative care agreement.   Total time spent:*** Minutes Time spent includes review of chart, medications, test results, and follow up plan with the patient.      Dr Lavera Guise Internal medicine

## 2021-08-07 ENCOUNTER — Other Ambulatory Visit: Payer: Self-pay | Admitting: Nurse Practitioner

## 2021-08-07 ENCOUNTER — Telehealth: Payer: Self-pay

## 2021-08-07 DIAGNOSIS — K219 Gastro-esophageal reflux disease without esophagitis: Secondary | ICD-10-CM

## 2021-08-07 MED ORDER — PANTOPRAZOLE SODIUM 40 MG PO TBEC: 40.0000 mg | DELAYED_RELEASE_TABLET | Freq: Two times a day (BID) | ORAL | 2 refills | Status: DC

## 2021-08-07 NOTE — Telephone Encounter (Signed)
Pt aware.

## 2021-08-07 NOTE — Telephone Encounter (Signed)
Need PA

## 2021-08-09 ENCOUNTER — Telehealth: Payer: Self-pay

## 2021-08-09 DIAGNOSIS — K219 Gastro-esophageal reflux disease without esophagitis: Secondary | ICD-10-CM

## 2021-08-09 MED ORDER — PANTOPRAZOLE SODIUM 40 MG PO TBEC: 40.0000 mg | DELAYED_RELEASE_TABLET | Freq: Two times a day (BID) | ORAL | 1 refills | Status: DC

## 2021-08-09 NOTE — Telephone Encounter (Signed)
PA for pantoprazol 40 mg twice daily was sent 08/09/21 @ 333 pm.  PA for pantoprazole 40 mg twice daily was approved 08/09/21 and valid to 08/09/2022 [id number].  New rx sent to pharmacy and pt notified

## 2021-08-30 ENCOUNTER — Other Ambulatory Visit: Payer: Self-pay

## 2021-08-30 ENCOUNTER — Telehealth: Payer: Managed Care, Other (non HMO) | Admitting: Nurse Practitioner

## 2021-08-30 ENCOUNTER — Encounter: Payer: Self-pay | Admitting: Nurse Practitioner

## 2021-08-30 VITALS — Ht 69.0 in | Wt 235.0 lb

## 2021-08-30 DIAGNOSIS — R051 Acute cough: Secondary | ICD-10-CM | POA: Diagnosis not present

## 2021-08-30 DIAGNOSIS — J0101 Acute recurrent maxillary sinusitis: Secondary | ICD-10-CM

## 2021-08-30 MED ORDER — DOXYCYCLINE HYCLATE 100 MG PO TABS: 100.0000 mg | ORAL_TABLET | Freq: Two times a day (BID) | ORAL | 0 refills | Status: DC

## 2021-08-30 NOTE — Progress Notes (Signed)
[facility] [place], [place] [address]  Internal MEDICINE  Telephone Visit  Patient Name: Alfred Gross  [id number]  [id number]  Date of Service: 08/30/2021  I connected with the patient at 10:55 AM by telephone and verified the patients identity using two identifiers.   I discussed the limitations, risks, security and privacy concerns of performing an evaluation and management service by telephone and the availability of in person appointments. I also discussed with the patient that there may be a patient responsible charge related to the service.  The patient expressed understanding and agrees to proceed.    Chief Complaint  Patient presents with   Telephone Assessment    [id number]   Telephone Screen    Covid test negative    Sinusitis   Cough    HPI Alfred Gross presents for a telehealth virtual visit for symptoms of sinusitis. He has a cough. His covid test was negative. He reports nasal congestion, runny nose, sinus pain/pressure, headaches, postnasal drip. He denies fever, chills, body aches and fatigue. He also denies any SOB, wheezing or chest tightness.    Current Medication: Outpatient Encounter Medications as of 08/30/2021  Medication Sig   albuterol (VENTOLIN HFA) 108 (90 Base) MCG/ACT inhaler Inhale 2 puffs into the lungs every 6 (six) hours as needed for wheezing or shortness of breath.   cetirizine (ZYRTEC) 10 MG tablet Take 10 mg by mouth daily.   citalopram (CELEXA) 20 MG tablet Take 1 tab per night   doxycycline (VIBRA-TABS) 100 MG tablet Take 1 tablet (100 mg total) by mouth 2 (two) times daily. With food   fluticasone (FLONASE) 50 MCG/ACT nasal spray Place 2 sprays into both nostrils 2 (two) times a day.   pantoprazole (PROTONIX) 40 MG tablet Take 1 tablet (40 mg total) by mouth 2 (two) times daily before a meal.   No facility-administered encounter medications on file as of 08/30/2021.    Surgical History: Past Surgical  History:  Procedure Laterality Date   no surgical history      Medical History: Past Medical History:  Diagnosis Date   Anxiety    Anxiety    Family history of colon cancer    Family history of leukemia    Family history of Lynch syndrome    Family history of prostate cancer     Family History: Family History  Problem Relation Age of Onset   Healthy Mother    Colon cancer Father        MSH2+ Lynch syndrome, CHEK2+   Colon cancer Paternal Uncle 51   Colon cancer Paternal Grandfather        "Lynch+"   Prostate cancer Paternal Grandfather        dx 89s   Leukemia Half-Brother 10   Colon cancer Half-Brother 65       "Lynch+"    Social History   Socioeconomic History   Marital status: Married    Spouse name: Not on file   Number of children: Not on file   Years of education: Not on file   Highest education level: Not on file  Occupational History   Not on file  Tobacco Use   Smoking status: Every Day    Packs/day: 1.00    Years: 10.00    Pack years: 10.00    Types: Cigarettes    Last attempt to quit: 11/28/2016    Years since quitting: 4.8   Smokeless tobacco: Never   Tobacco comments:    1/2 pack  a day   Vaping Use   Vaping Use: Never used  Substance and Sexual Activity   Alcohol use: No   Drug use: No   Sexual activity: Not on file  Other Topics Concern   Not on file  Social History Narrative   Not on file   Social Determinants of Health   Financial Resource Strain: Not on file  Food Insecurity: Not on file  Transportation Needs: Not on file  Physical Activity: Not on file  Stress: Not on file  Social Connections: Not on file  Intimate Partner Violence: Not on file      Review of Systems  Constitutional:  Negative for appetite change, diaphoresis, fatigue and fever.  HENT:  Positive for congestion, postnasal drip, rhinorrhea, sinus pressure, sinus pain, sneezing and sore throat. Negative for ear pain and trouble swallowing.   Respiratory:   Positive for cough. Negative for apnea, shortness of breath and wheezing.   Cardiovascular: Negative.  Negative for chest pain and palpitations.  Gastrointestinal: Negative.  Negative for abdominal pain, constipation, diarrhea, nausea and vomiting.  Musculoskeletal: Negative.  Negative for myalgias.  Skin: Negative.  Negative for rash.  Neurological:  Positive for headaches.   Vital Signs: Ht 5' 9"  (1.753 m)   Wt 235 lb (106.6 kg)   BMI 34.70 kg/m    Observation/Objective: He is alert and oriented and engages in conversation appropriately. He does not appear to be in any acute distress on video call.     Assessment/Plan: 1. Acute recurrent maxillary sinusitis Empiric antibiotic treatment prescribed.  - doxycycline (VIBRA-TABS) 100 MG tablet; Take 1 tablet (100 mg total) by mouth 2 (two) times daily. With food  Dispense: 14 tablet; Refill: 0  2. Acute cough Patient will continue using desired OTC cough medicine. He declined prescription for cough suppressant.    General Counseling: Alfred Gross understanding of the findings of today's phone visit and agrees with plan of treatment. I have discussed any further diagnostic evaluation that may be needed or ordered today. We also reviewed his medications today. he has been encouraged to call the office with any questions or concerns that should arise related to todays visit.  Return if symptoms worsen or fail to improve.   No orders of the defined types were placed in this encounter.   Meds ordered this encounter  Medications   doxycycline (VIBRA-TABS) 100 MG tablet    Sig: Take 1 tablet (100 mg total) by mouth 2 (two) times daily. With food    Dispense:  14 tablet    Refill:  0   Return if symptoms worsen or fail to improve.  Time spent:10 Minutes Time spent with patient included reviewing progress notes, labs, imaging studies, and discussing plan for follow up.  Alfred Gross Controlled Substance Database was reviewed by me  for overdose risk score (ORS) if appropriate.  This patient was seen by Alfred Osgood, FNP-C in collaboration with Dr. Clayborn Gross as a part of collaborative care agreement.  Alfred Gross R. Valetta Fuller, MSN, FNP-C Internal medicine

## 2021-09-21 ENCOUNTER — Encounter: Payer: Self-pay | Admitting: Nurse Practitioner

## 2021-09-27 ENCOUNTER — Ambulatory Visit: Payer: Managed Care, Other (non HMO) | Admitting: Nurse Practitioner

## 2021-10-16 ENCOUNTER — Telehealth: Payer: Self-pay

## 2021-10-16 ENCOUNTER — Other Ambulatory Visit: Payer: Self-pay

## 2021-10-16 MED ORDER — OSELTAMIVIR PHOSPHATE 75 MG PO CAPS: 75.0000 mg | ORAL_CAPSULE | Freq: Two times a day (BID) | ORAL | 0 refills | Status: DC

## 2021-10-16 NOTE — Telephone Encounter (Signed)
Pt called that his daughter and son and he is having flu like a symptoms as per alyssa send Tamiflu for 5 days

## 2021-10-19 ENCOUNTER — Encounter: Payer: Self-pay | Admitting: Physician Assistant

## 2021-10-19 ENCOUNTER — Telehealth: Payer: Managed Care, Other (non HMO) | Admitting: Physician Assistant

## 2021-10-19 DIAGNOSIS — J069 Acute upper respiratory infection, unspecified: Secondary | ICD-10-CM | POA: Diagnosis not present

## 2021-10-19 MED ORDER — DOXYCYCLINE HYCLATE 100 MG PO TABS: 100.0000 mg | ORAL_TABLET | Freq: Two times a day (BID) | ORAL | 0 refills | Status: DC

## 2021-10-19 NOTE — Progress Notes (Signed)
[facility] [place], [place] [address]  Internal MEDICINE  Telephone Visit  Patient Name: Alfred Gross  [id number]  [id number]  Date of Service: 10/19/2021  I connected with the patient at 3:38 by telephone and verified the patients identity using two identifiers.   I discussed the limitations, risks, security and privacy concerns of performing an evaluation and management service by telephone and the availability of in person appointments. I also discussed with the patient that there may be a patient responsible charge related to the service.  The patient expressed understanding and agrees to proceed.    Chief Complaint  Patient presents with   Telephone Assessment    [id number]   Telephone Screen    Exposure to flu    Cough    HPI Pt is here for virtual sick visit -He called earlier this week with flu like symptoms and exposure to kids who were sick with flu and was given 5 days of Tamiflu which he is still taking, however he still has symptoms and feels worse. -Fever resolved, but coughing and feeling like something sitting on his chest with lots of congestion, wheezing and SOB  -Has taken ibuprofen -used albuterol today -covid negative yesterday -reports the last time he had a bad sinus infection with cough the antibiotics given then really helped--this was doxycycline per chart  Current Medication: Outpatient Encounter Medications as of 10/19/2021  Medication Sig   albuterol (VENTOLIN HFA) 108 (90 Base) MCG/ACT inhaler Inhale 2 puffs into the lungs every 6 (six) hours as needed for wheezing or shortness of breath.   cetirizine (ZYRTEC) 10 MG tablet Take 10 mg by mouth daily.   citalopram (CELEXA) 20 MG tablet Take 1 tab per night   doxycycline (VIBRA-TABS) 100 MG tablet Take 1 tablet (100 mg total) by mouth 2 (two) times daily. With food   fluticasone (FLONASE) 50 MCG/ACT nasal spray Place 2 sprays into both nostrils 2 (two) times a day.    oseltamivir (TAMIFLU) 75 MG capsule Take 1 capsule (75 mg total) by mouth 2 (two) times daily.   pantoprazole (PROTONIX) 40 MG tablet Take 1 tablet (40 mg total) by mouth 2 (two) times daily before a meal.   [DISCONTINUED] doxycycline (VIBRA-TABS) 100 MG tablet Take 1 tablet (100 mg total) by mouth 2 (two) times daily. With food   No facility-administered encounter medications on file as of 10/19/2021.    Surgical History: Past Surgical History:  Procedure Laterality Date   no surgical history      Medical History: Past Medical History:  Diagnosis Date   Anxiety    Anxiety    Family history of colon cancer    Family history of leukemia    Family history of Lynch syndrome    Family history of prostate cancer     Family History: Family History  Problem Relation Age of Onset   Healthy Mother    Colon cancer Father        MSH2+ Lynch syndrome, CHEK2+   Colon cancer Paternal Uncle 2   Colon cancer Paternal Grandfather        "Lynch+"   Prostate cancer Paternal Grandfather        dx 50s   Leukemia Half-Brother 84   Colon cancer Half-Brother 9       "Lynch+"    Social History   Socioeconomic History   Marital status: Married    Spouse name: Not on file   Number of children: Not on file  Years of education: Not on file   Highest education level: Not on file  Occupational History   Not on file  Tobacco Use   Smoking status: Every Day    Packs/day: 1.00    Years: 10.00    Pack years: 10.00    Types: Cigarettes    Last attempt to quit: 11/28/2016    Years since quitting: 4.8   Smokeless tobacco: Never   Tobacco comments:    1/2 pack a day   Vaping Use   Vaping Use: Never used  Substance and Sexual Activity   Alcohol use: No   Drug use: No   Sexual activity: Not on file  Other Topics Concern   Not on file  Social History Narrative   Not on file   Social Determinants of Health   Financial Resource Strain: Not on file  Food Insecurity: Not on file   Transportation Needs: Not on file  Physical Activity: Not on file  Stress: Not on file  Social Connections: Not on file  Intimate Partner Violence: Not on file      Review of Systems  Constitutional:  Positive for fatigue. Negative for fever.  HENT:  Positive for congestion, postnasal drip and sinus pressure. Negative for mouth sores.   Respiratory:  Positive for cough, shortness of breath and wheezing.   Cardiovascular:  Negative for chest pain.  Genitourinary:  Negative for flank pain.  Psychiatric/Behavioral: Negative.     Vital Signs: Temp 98.9 F (37.2 C)    Ht 5' 9"  (1.753 m)    Wt 235 lb (106.6 kg)    BMI 34.70 kg/m    Observation/Objective:  Pt is able to carry out conversation   Assessment/Plan: 1. Upper respiratory tract infection, unspecified type Will start doxycycline for sinusitis/URI and may complete tamiflu. May also take flonase and mucinex to help with congestion and can use his albuterol inhaler as needed. Advised to rest and stay hydrated. - doxycycline (VIBRA-TABS) 100 MG tablet; Take 1 tablet (100 mg total) by mouth 2 (two) times daily. With food  Dispense: 14 tablet; Refill: 0   General Counseling: Salome verbalizes understanding of the findings of today's phone visit and agrees with plan of treatment. I have discussed any further diagnostic evaluation that may be needed or ordered today. We also reviewed his medications today. he has been encouraged to call the office with any questions or concerns that should arise related to todays visit.    No orders of the defined types were placed in this encounter.   Meds ordered this encounter  Medications   doxycycline (VIBRA-TABS) 100 MG tablet    Sig: Take 1 tablet (100 mg total) by mouth 2 (two) times daily. With food    Dispense:  14 tablet    Refill:  0    Time spent:25 Minutes    Dr Lavera Guise Internal medicine

## 2021-10-31 ENCOUNTER — Other Ambulatory Visit: Payer: Self-pay

## 2021-10-31 ENCOUNTER — Encounter: Payer: Self-pay | Admitting: Nurse Practitioner

## 2021-10-31 ENCOUNTER — Ambulatory Visit: Payer: Managed Care, Other (non HMO) | Admitting: Nurse Practitioner

## 2021-10-31 VITALS — BP 133/74 | HR 81 | Temp 98.4°F | Resp 16 | Ht 69.0 in | Wt 241.8 lb

## 2021-10-31 DIAGNOSIS — F411 Generalized anxiety disorder: Secondary | ICD-10-CM

## 2021-10-31 DIAGNOSIS — R051 Acute cough: Secondary | ICD-10-CM

## 2021-10-31 DIAGNOSIS — F5104 Psychophysiologic insomnia: Secondary | ICD-10-CM

## 2021-10-31 MED ORDER — CITALOPRAM HYDROBROMIDE 20 MG PO TABS: ORAL_TABLET | ORAL | 1 refills | Status: DC

## 2021-10-31 MED ORDER — ALPRAZOLAM 0.5 MG PO TABS: 0.5000 mg | ORAL_TABLET | Freq: Every evening | ORAL | 0 refills | Status: DC | PRN

## 2021-10-31 NOTE — Progress Notes (Signed)
[facility] [place], [place] [address]  Internal MEDICINE  Office Visit Note  Patient Name: Alfred Gross  [id number]  [id number]  Date of Service: 10/31/2021  Chief Complaint  Patient presents with   Follow-up   Anxiety    HPI Alfred Gross presents for a follow up visit for refills of anxiety medication. He has recently recovered from an upper respiratory infection with lingering symptoms of cough, congestion and fatigue. He initially thought he had the flu after he had been exposed to family members who were diagnosed with the flu. He went through a course of tamiflu without being tested for the flu because he called at the end of the day on a Friday but this did not help. He had a virtual visit after that and was diagnosed with probably sinusitis and prescribed an antibiotic. He reports now that he thinks it was actually covid because the antibiotic didn't help either. If it was covid, the initial date that the symptoms started was 2-3 weeks ago so he is past quarantine period. He reports that every time he gets sick, his anxiety level is "sky high". He often feels like he is starting from scratch. He worries about everything and is not sure why. He worries about how bad the infection it and all the different symptoms he has or could have. He feels like his anxiety medication does not work while he is sick. He is currently having difficulty sleeping as well.    Current Medication: Outpatient Encounter Medications as of 10/31/2021  Medication Sig   albuterol (VENTOLIN HFA) 108 (90 Base) MCG/ACT inhaler Inhale 2 puffs into the lungs every 6 (six) hours as needed for wheezing or shortness of breath.   ALPRAZolam (XANAX) 0.5 MG tablet Take 1 tablet (0.5 mg total) by mouth at bedtime as needed for anxiety or sleep.   cetirizine (ZYRTEC) 10 MG tablet Take 10 mg by mouth daily.   fluticasone (FLONASE) 50 MCG/ACT nasal spray Place 2 sprays into both nostrils 2 (two)  times a day.   pantoprazole (PROTONIX) 40 MG tablet Take 1 tablet (40 mg total) by mouth 2 (two) times daily before a meal.   [DISCONTINUED] citalopram (CELEXA) 20 MG tablet Take 1 tab per night   [DISCONTINUED] doxycycline (VIBRA-TABS) 100 MG tablet Take 1 tablet (100 mg total) by mouth 2 (two) times daily. With food   citalopram (CELEXA) 20 MG tablet Take 1 tab per night   [DISCONTINUED] oseltamivir (TAMIFLU) 75 MG capsule Take 1 capsule (75 mg total) by mouth 2 (two) times daily. (Patient not taking: Reported on 10/31/2021)   No facility-administered encounter medications on file as of 10/31/2021.    Surgical History: Past Surgical History:  Procedure Laterality Date   no surgical history      Medical History: Past Medical History:  Diagnosis Date   Anxiety    Anxiety    Family history of colon cancer    Family history of leukemia    Family history of Lynch syndrome    Family history of prostate cancer     Family History: Family History  Problem Relation Age of Onset   Healthy Mother    Colon cancer Father        MSH2+ Lynch syndrome, CHEK2+   Colon cancer Paternal Uncle 45   Colon cancer Paternal Grandfather        "Lynch+"   Prostate cancer Paternal Grandfather        dx 23s   Leukemia Half-Brother  50   Colon cancer Half-Brother 31       "Lynch+"    Social History   Socioeconomic History   Marital status: Married    Spouse name: Not on file   Number of children: Not on file   Years of education: Not on file   Highest education level: Not on file  Occupational History   Not on file  Tobacco Use   Smoking status: Every Day    Years: 10.00    Types: Cigarettes    Last attempt to quit: 11/28/2016    Years since quitting: 4.9   Smokeless tobacco: Never   Tobacco comments:    4-5 cigarettes a day  Vaping Use   Vaping Use: Never used  Substance and Sexual Activity   Alcohol use: No   Drug use: No   Sexual activity: Not on file  Other Topics Concern   Not  on file  Social History Narrative   Not on file   Social Determinants of Health   Financial Resource Strain: Not on file  Food Insecurity: Not on file  Transportation Needs: Not on file  Physical Activity: Not on file  Stress: Not on file  Social Connections: Not on file  Intimate Partner Violence: Not on file      Review of Systems  Constitutional:  Positive for fatigue (low energy). Negative for chills and fever.  HENT:  Positive for congestion and postnasal drip. Negative for ear pain, rhinorrhea, sinus pressure, sinus pain, sneezing and sore throat.   Respiratory:  Positive for cough and chest tightness (congestion). Negative for shortness of breath and wheezing.   Cardiovascular: Negative.  Negative for chest pain and palpitations.  Gastrointestinal:  Negative for abdominal pain, constipation, diarrhea, nausea and vomiting.  Musculoskeletal:  Negative for myalgias.  Skin:  Negative for rash.  Neurological:  Negative for headaches.   Vital Signs: BP 133/74    Pulse 81    Temp 98.4 F (36.9 C)    Resp 16    Ht _0  (1.753 m)    Wt 241 lb 12.8 oz (109.7 kg)    SpO2 96%    BMI 35.71 kg/m    Physical Exam Vitals reviewed.  Constitutional:      General: He is not in acute distress.    Appearance: Normal appearance. He is obese. He is not ill-appearing.  HENT:     Head: Normocephalic and atraumatic.  Eyes:     Pupils: Pupils are equal, round, and reactive to light.  Cardiovascular:     Rate and Rhythm: Normal rate and regular rhythm.  Pulmonary:     Effort: Pulmonary effort is normal. No respiratory distress.  Neurological:     Mental Status: He is alert and oriented to person, place, and time.     Cranial Nerves: No cranial nerve deficit.     Coordination: Coordination normal.     Gait: Gait normal.  Psychiatric:        Mood and Affect: Mood normal.        Behavior: Behavior normal.       Assessment/Plan: 1. Acute cough Manageable with OTC medications  currently  2. GAD (generalized anxiety disorder) Refills ordered of citaopram and alprazolam.  - citalopram (CELEXA) 20 MG tablet; Take 1 tab per night  Dispense: 90 tablet; Refill: 1 - ALPRAZolam (XANAX) 0.5 MG tablet; Take 1 tablet (0.5 mg total) by mouth at bedtime as needed for anxiety or sleep.  Dispense: 30 tablet; Refill: 0  3. Psychophysiological insomnia Alprazolam ordered to take at bed time as needed of anxiety and insomnia related to anixety.  - ALPRAZolam (XANAX) 0.5 MG tablet; Take 1 tablet (0.5 mg total) by mouth at bedtime as needed for anxiety or sleep.  Dispense: 30 tablet; Refill: 0   General Counseling: Tyrrell verbalizes understanding of the findings of todays visit and agrees with plan of treatment. I have discussed any further diagnostic evaluation that may be needed or ordered today. We also reviewed his medications today. he has been encouraged to call the office with any questions or concerns that should arise related to todays visit.    No orders of the defined types were placed in this encounter.   Meds ordered this encounter  Medications   citalopram (CELEXA) 20 MG tablet    Sig: Take 1 tab per night    Dispense:  90 tablet    Refill:  1   ALPRAZolam (XANAX) 0.5 MG tablet    Sig: Take 1 tablet (0.5 mg total) by mouth at bedtime as needed for anxiety or sleep.    Dispense:  30 tablet    Refill:  0    Return in about 1 month (around 12/01/2021) for F/U, eval new med, [place] PCP.   Total time spent:30 Minutes Time spent includes review of chart, medications, test results, and follow up plan with the patient.   [place] Controlled Substance Database was reviewed by me.  This patient was seen by Jonetta Osgood, FNP-C in collaboration with Dr. Clayborn Bigness as a part of collaborative care agreement.   Gennell How R. Valetta Fuller, MSN, FNP-C Internal medicine

## 2021-12-01 ENCOUNTER — Encounter: Payer: Managed Care, Other (non HMO) | Admitting: Nurse Practitioner

## 2021-12-04 ENCOUNTER — Ambulatory Visit: Payer: Managed Care, Other (non HMO) | Admitting: Nurse Practitioner

## 2022-03-04 ENCOUNTER — Other Ambulatory Visit: Payer: Self-pay | Admitting: Nurse Practitioner

## 2022-03-04 DIAGNOSIS — K219 Gastro-esophageal reflux disease without esophagitis: Secondary | ICD-10-CM

## 2022-03-22 ENCOUNTER — Encounter: Payer: Self-pay | Admitting: Physician Assistant

## 2022-03-22 ENCOUNTER — Ambulatory Visit: Payer: Managed Care, Other (non HMO) | Admitting: Physician Assistant

## 2022-03-22 VITALS — BP 133/85 | HR 87 | Temp 97.6°F | Resp 16 | Ht 69.0 in | Wt 246.0 lb

## 2022-03-22 DIAGNOSIS — E669 Obesity, unspecified: Secondary | ICD-10-CM

## 2022-03-22 DIAGNOSIS — G4710 Hypersomnia, unspecified: Secondary | ICD-10-CM | POA: Diagnosis not present

## 2022-03-22 DIAGNOSIS — R002 Palpitations: Secondary | ICD-10-CM | POA: Diagnosis not present

## 2022-03-22 DIAGNOSIS — F411 Generalized anxiety disorder: Secondary | ICD-10-CM

## 2022-03-22 NOTE — Progress Notes (Addendum)
[facility] [address] [place], [place] [address]  Internal MEDICINE  Office Visit Note  Patient Name: Alfred Gross  [id number]  [id number]  Date of Service: 03/29/2022  Chief Complaint  Patient presents with   Palpitations    Once a week for 3 months - typically starts in afternoon till bedtime     HPI Pt is here for a sick visit. -palpitations for the last 3 months that occur about 1-2 times per week. Hasn't noticed a change in anxiety when these occur. Can last 30 minutes, but sometimes longer. Seems to occur after he is more tired. Denies any CP or SOB. -the palpitations then make him anxious, when his HR rises its in the 90s, but not much higher -he reports having these in the past and has had several studies done that have been normal. Discussed considering holter monitor, but states he has done this before -Has found that anytime he is sick or on antibiotics that his celexa stops working. Was on clindamycin recently  -Reports he is snoring some nights, fatigue most of the day. Wakes up at 6am and has energy until about 11am and then pretty tired. Discussed ordering a sleep study for further evaluation as untreated OSA can contribute to changes with the heart and fatigue and an overnight study can look for any arrhythmias throughout the night  EPWORTH SLEEPINESS SCALE:  Scale:  (0)= no chance of dozing; (1)= slight chance of dozing; (2)= moderate chance of dozing; (3)= high chance of dozing  Chance  Situtation    Sitting and reading: 3    Watching TV: 2    Sitting Inactive in public: 0    As a passenger in car: 0      Lying down to rest: 3    Sitting and talking: 0    Sitting quielty after lunch: 0    In a car, stopped in traffic: 0   TOTAL SCORE:   8 out of 24     Current Medication:  Outpatient Encounter Medications as of 03/22/2022  Medication Sig   albuterol (VENTOLIN HFA) 108 (90 Base) MCG/ACT inhaler Inhale 2 puffs into the  lungs every 6 (six) hours as needed for wheezing or shortness of breath.   cetirizine (ZYRTEC) 10 MG tablet Take 10 mg by mouth daily.   fluticasone (FLONASE) 50 MCG/ACT nasal spray Place 2 sprays into both nostrils 2 (two) times a day.   pantoprazole (PROTONIX) 40 MG tablet TAKE 1 TABLET(40 MG) BY MOUTH TWICE DAILY BEFORE A MEAL   [DISCONTINUED] ALPRAZolam (XANAX) 0.5 MG tablet Take 1 tablet (0.5 mg total) by mouth at bedtime as needed for anxiety or sleep.   [DISCONTINUED] citalopram (CELEXA) 20 MG tablet Take 1 tab per night   No facility-administered encounter medications on file as of 03/22/2022.      Medical History: Past Medical History:  Diagnosis Date   Anxiety    Anxiety    Family history of colon cancer    Family history of leukemia    Family history of Lynch syndrome    Family history of prostate cancer      Vital Signs: BP 133/85   Pulse 87   Temp 97.6 F (36.4 C)   Resp 16   Ht 5\' 9"  (1.753 m)   Wt 246 lb (111.6 kg)   SpO2 99%   BMI 36.33 kg/m    Review of Systems  Constitutional:  Positive for fatigue. Negative for fever.  HENT:  Negative  for congestion, mouth sores and postnasal drip.   Respiratory:  Negative for cough and shortness of breath.   Cardiovascular:  Positive for palpitations. Negative for chest pain and leg swelling.  Genitourinary:  Negative for flank pain.  Psychiatric/Behavioral:  Positive for sleep disturbance. The patient is nervous/anxious.    Physical Exam Vitals and nursing note reviewed.  Constitutional:      General: He is not in acute distress.    Appearance: Normal appearance. He is obese. He is not ill-appearing.  HENT:     Head: Normocephalic and atraumatic.  Eyes:     Pupils: Pupils are equal, round, and reactive to light.  Cardiovascular:     Rate and Rhythm: Normal rate and regular rhythm.  Pulmonary:     Effort: Pulmonary effort is normal. No respiratory distress.  Skin:    General: Skin is warm and dry.   Neurological:     Mental Status: He is alert and oriented to person, place, and time.     Cranial Nerves: No cranial nerve deficit.     Coordination: Coordination normal.     Gait: Gait normal.  Psychiatric:        Mood and Affect: Mood normal.        Thought Content: Thought content normal.        Judgment: Judgment normal.     Comments: Anxious in office      Assessment/Plan: 1. Palpitations - EKG 12-Lead showed sinus rhythm with rate of 81. Discussed considering BB to be used as needed for tachycardia/palpitations, however HR doesn't go above 90s and is usually normal and he does not want to drop HR too low and feel more tired. May reconsider low dose BB in future. Denies any CP or SOB, but was advised to go to ED if any new or worsening symptoms - PS[facility] SLEEP STUDY  2. Hypersomnia Based on snoring, fatigue, palpitations and elevated BMI, will order PSG for further evaluation - PSG SLEEP STUDY  3. GAD (generalized anxiety disorder) Will continue current medication for now, may consider change in future. May discuss further at CPE next week  4. Obesity (BMI 30-39.0) Will order PSG for further evaluation   General Counseling: [name] verbalizes understanding of the findings of todays visit and agrees with plan of treatment. I have discussed any further diagnostic evaluation that may be needed or ordered today. We also reviewed his medications today. he has been encouraged to call the office with any questions or concerns that should arise related to todays visit.    Counseling:    Orders Placed This Encounter  Procedures   EKG 12-Lead   PSG SLEEP STUDY    No orders of the defined types were placed in this encounter.   Time spent:35 Minutes

## 2022-03-28 ENCOUNTER — Ambulatory Visit (INDEPENDENT_AMBULATORY_CARE_PROVIDER_SITE_OTHER): Payer: Managed Care, Other (non HMO) | Admitting: Nurse Practitioner

## 2022-03-28 ENCOUNTER — Encounter: Payer: Self-pay | Admitting: Nurse Practitioner

## 2022-03-28 VITALS — BP 127/78 | HR 70 | Temp 98.4°F | Resp 16 | Ht 69.0 in | Wt 238.0 lb

## 2022-03-28 DIAGNOSIS — F5104 Psychophysiologic insomnia: Secondary | ICD-10-CM

## 2022-03-28 DIAGNOSIS — R630 Anorexia: Secondary | ICD-10-CM | POA: Diagnosis not present

## 2022-03-28 DIAGNOSIS — E538 Deficiency of other specified B group vitamins: Secondary | ICD-10-CM

## 2022-03-28 DIAGNOSIS — E559 Vitamin D deficiency, unspecified: Secondary | ICD-10-CM

## 2022-03-28 DIAGNOSIS — R5383 Other fatigue: Secondary | ICD-10-CM | POA: Diagnosis not present

## 2022-03-28 DIAGNOSIS — R002 Palpitations: Secondary | ICD-10-CM | POA: Diagnosis not present

## 2022-03-28 DIAGNOSIS — Z0001 Encounter for general adult medical examination with abnormal findings: Secondary | ICD-10-CM | POA: Diagnosis not present

## 2022-03-28 DIAGNOSIS — E782 Mixed hyperlipidemia: Secondary | ICD-10-CM

## 2022-03-28 DIAGNOSIS — G4710 Hypersomnia, unspecified: Secondary | ICD-10-CM

## 2022-03-28 DIAGNOSIS — F411 Generalized anxiety disorder: Secondary | ICD-10-CM

## 2022-03-28 DIAGNOSIS — R300 Dysuria: Secondary | ICD-10-CM

## 2022-03-28 MED ORDER — ALPRAZOLAM 0.5 MG PO TABS: 0.5000 mg | ORAL_TABLET | Freq: Every evening | ORAL | 0 refills | Status: DC | PRN

## 2022-03-28 MED ORDER — ESCITALOPRAM OXALATE 10 MG PO TABS: 10.0000 mg | ORAL_TABLET | Freq: Every day | ORAL | 2 refills | Status: DC

## 2022-03-28 NOTE — Progress Notes (Signed)
[facility] [address] [place], [place] [address]  Internal MEDICINE  Office Visit Note  Patient Name: Alfred Gross  [id number]  [id number]  Date of Service: 03/28/2022  Chief Complaint  Patient presents with   Annual Exam   Anxiety   Medication Refill    HPI Alfred Gross presents for an annual well visit and physical exam. He is a well appearing 31 yo male with GERD, allergic rhinitis, hyperlipidemia, and anxiety. He is due for routine labs. He has been having issues with increased anxiety lately, palpitations, trouble sleeping and decreased appetite. He was seen in the office by Lynn Ito PA-C last week for palpitations and his EKG was normal. He also had a sleep study ordered since he was having hypersomnia as well.  With snoring, fatigue, palpitations and elevated BMI, a sleep study is important to evaluate for OSA. He has not received a call to schedule the sleep study yet.  He does need medication refills today.  With the increased anxiety, he is requesting to try medication.     Current Medication: Outpatient Encounter Medications as of 03/28/2022  Medication Sig   albuterol (VENTOLIN HFA) 108 (90 Base) MCG/ACT inhaler Inhale 2 puffs into the lungs every 6 (six) hours as needed for wheezing or shortness of breath.   cetirizine (ZYRTEC) 10 MG tablet Take 10 mg by mouth daily.   escitalopram (LEXAPRO) 10 MG tablet Take 1 tablet (10 mg total) by mouth daily.   fluticasone (FLONASE) 50 MCG/ACT nasal spray Place 2 sprays into both nostrils 2 (two) times a day.   pantoprazole (PROTONIX) 40 MG tablet TAKE 1 TABLET(40 MG) BY MOUTH TWICE DAILY BEFORE A MEAL   [DISCONTINUED] ALPRAZolam (XANAX) 0.5 MG tablet Take 1 tablet (0.5 mg total) by mouth at bedtime as needed for anxiety or sleep.   [DISCONTINUED] citalopram (CELEXA) 20 MG tablet Take 1 tab per night   ALPRAZolam (XANAX) 0.5 MG tablet Take 1 tablet (0.5 mg total) by mouth at bedtime as needed for  anxiety or sleep.   No facility-administered encounter medications on file as of 03/28/2022.    Surgical History: Past Surgical History:  Procedure Laterality Date   no surgical history      Medical History: Past Medical History:  Diagnosis Date   Anxiety    Anxiety    Family history of colon cancer    Family history of leukemia    Family history of Lynch syndrome    Family history of prostate cancer     Family History: Family History  Problem Relation Age of Onset   Healthy Mother    Colon cancer Father        MSH2+ Lynch syndrome, CHEK2+   Colon cancer Paternal Uncle 24   Colon cancer Paternal Grandfather        "Lynch+"   Prostate cancer Paternal Grandfather        dx 16s   Leukemia Half-Brother 7   Colon cancer Half-Brother 80       "Lynch+"    Social History   Socioeconomic History   Marital status: Married    Spouse name: Not on file   Number of children: Not on file   Years of education: Not on file   Highest education level: Not on file  Occupational History   Not on file  Tobacco Use   Smoking status: Every Day    Years: 10.00    Types: Cigarettes    Last attempt to quit: 11/28/2016  Years since quitting: 5.3   Smokeless tobacco: Never   Tobacco comments:    4-5 cigarettes a day  Vaping Use   Vaping Use: Never used  Substance and Sexual Activity   Alcohol use: No   Drug use: No   Sexual activity: Not on file  Other Topics Concern   Not on file  Social History Narrative   Not on file   Social Determinants of Health   Financial Resource Strain: Not on file  Food Insecurity: Not on file  Transportation Needs: Not on file  Physical Activity: Not on file  Stress: Not on file  Social Connections: Not on file  Intimate Partner Violence: Not on file      Review of Systems  Constitutional:  Positive for fatigue. Negative for activity change, appetite change, chills, fever and unexpected weight change.  HENT: Negative.  Negative for  congestion, ear pain, rhinorrhea, sore throat and trouble swallowing.   Eyes: Negative.   Respiratory: Negative.  Negative for cough, chest tightness, shortness of breath and wheezing.   Cardiovascular:  Positive for palpitations. Negative for chest pain.  Gastrointestinal: Negative.  Negative for abdominal pain, blood in stool, constipation, diarrhea, nausea and vomiting.  Endocrine: Negative.   Genitourinary: Negative.  Negative for difficulty urinating, dysuria, frequency, hematuria and urgency.  Musculoskeletal: Negative.  Negative for arthralgias, back pain, joint swelling, myalgias and neck pain.  Skin: Negative.  Negative for rash and wound.  Allergic/Immunologic: Negative.  Negative for immunocompromised state.  Neurological: Negative.  Negative for dizziness, seizures, numbness and headaches.  Hematological: Negative.   Psychiatric/Behavioral:  Positive for sleep disturbance. Negative for behavioral problems, self-injury and suicidal ideas. The patient is nervous/anxious.     Vital Signs: BP 127/78   Pulse 70   Temp 98.4 F (36.9 C)   Resp 16   Ht $R'5\' 9"'ZN$  (1.753 m)   Wt 238 lb (108 kg)   SpO2 99%   BMI 35.15 kg/m    Physical Exam Vitals reviewed.  Constitutional:      General: He is awake. He is not in acute distress.    Appearance: Normal appearance. He is well-developed and well-groomed. He is obese. He is not ill-appearing or diaphoretic.  HENT:     Head: Normocephalic and atraumatic.     Right Ear: Tympanic membrane, ear canal and external ear normal.     Left Ear: Tympanic membrane, ear canal and external ear normal.     Nose: Nose normal. No congestion or rhinorrhea.     Mouth/Throat:     Lips: Pink.     Mouth: Mucous membranes are moist.     Pharynx: Oropharynx is clear. Uvula midline. No oropharyngeal exudate or posterior oropharyngeal erythema.  Eyes:     General: Lids are normal. Vision grossly intact. Gaze aligned appropriately. No scleral icterus.        Right eye: No discharge.        Left eye: No discharge.     Extraocular Movements: Extraocular movements intact.     Conjunctiva/sclera: Conjunctivae normal.     Pupils: Pupils are equal, round, and reactive to light.     Funduscopic exam:    Right eye: Red reflex present.        Left eye: Red reflex present. Neck:     Thyroid: No thyromegaly.     Vascular: No carotid bruit or JVD.     Trachea: Trachea and phonation normal. No tracheal deviation.  Cardiovascular:     Rate and  Rhythm: Normal rate and regular rhythm.     Pulses: Normal pulses.     Heart sounds: Normal heart sounds, S1 normal and S2 normal. No murmur heard.    No friction rub. No gallop.  Pulmonary:     Effort: Pulmonary effort is normal. No accessory muscle usage or respiratory distress.     Breath sounds: Normal breath sounds and air entry. No stridor. No wheezing or rales.  Chest:     Chest wall: No tenderness.  Abdominal:     General: Bowel sounds are normal. There is no distension.     Palpations: Abdomen is soft. There is no shifting dullness, fluid wave, mass or pulsatile mass.     Tenderness: There is no abdominal tenderness. There is no guarding or rebound.  Musculoskeletal:        General: No tenderness or deformity. Normal range of motion.     Cervical back: Normal range of motion and neck supple.     Right lower leg: No edema.     Left lower leg: No edema.  Lymphadenopathy:     Cervical: No cervical adenopathy.  Skin:    General: Skin is warm and dry.     Capillary Refill: Capillary refill takes less than 2 seconds.     Coloration: Skin is not pale.     Findings: No erythema or rash.  Neurological:     Mental Status: He is alert and oriented to person, place, and time.     Cranial Nerves: No cranial nerve deficit.     Motor: No abnormal muscle tone.     Coordination: Coordination normal.     Deep Tendon Reflexes: Reflexes are normal and symmetric.  Psychiatric:        Mood and Affect: Affect  normal. Mood is anxious.        Speech: Speech normal.        Behavior: Behavior normal. Behavior is cooperative.        Thought Content: Thought content normal. Thought content is not paranoid.        Judgment: Judgment normal.        Assessment/Plan: 1. Encounter for general adult medical examination with abnormal findings Age-appropriate preventive screenings and vaccinations discussed, annual physical exam completed. Routine labs for health maintenance ordered, see below. PHM updated.   2. Palpitations EKG was normal last week. Labs ordered. Also palpitations can be a physical manifestation of anxiety. Denies any chest pain. Sleep study ordered as well, waiting for a call for it to be scheduled. Will consider referral to cardiology as well.  - CBC with Differential/Platelet - CMP14+EGFR - B12 and Folate Panel - Iron, TIBC and Ferritin Panel - TSH + free T4  3. Other fatigue Patient is not sleeping well and has increased anxiety, he is also waiting for a sleep study to be scheduled to determine if he has sleep apnea. Also additional labs ordered to evaluate for other possible causes of fatigue.  - CBC with Differential/Platelet - CMP14+EGFR - Vitamin D (25 hydroxy) - B12 and Folate Panel - Iron, TIBC and Ferritin Panel - TSH + free T4  4. Decreased appetite Routine and additional labs ordered, this may be a side effect of a medication or a physical manifestation of increased anxiety.  - CBC with Differential/Platelet - CMP14+EGFR - Lipid Profile - Vitamin D (25 hydroxy) - B12 and Folate Panel - Iron, TIBC and Ferritin Panel - TSH + free T4  5. Vitamin D deficiency Routine lab ordered -  Vitamin D (25 hydroxy)  6. Mixed hyperlipidemia Routine lab ordered.  - Lipid Profile  7. B12 deficiency Routine lab ordered - CBC with Differential/Platelet - B12 and Folate Panel - Iron, TIBC and Ferritin Panel  8. Dysuria Routine urinalysis done - UA/M w/rflx Culture,  Routine - Microscopic Examination  9. GAD (generalized anxiety disorder) Prescribed escitalopram to help decrease anxiety, follow up in 1 month to evaluate new medication.  - escitalopram (LEXAPRO) 10 MG tablet; Take 1 tablet (10 mg total) by mouth daily.  Dispense: 30 tablet; Refill: 2  10. Psychophysiological insomnia Small dose of alprazolam prescribed for increased anxiety and to help him sleep.       General Counseling: briana newman understanding of the findings of todays visit and agrees with plan of treatment. I have discussed any further diagnostic evaluation that may be needed or ordered today. We also reviewed his medications today. he has been encouraged to call the office with any questions or concerns that should arise related to todays visit.    Orders Placed This Encounter  Procedures   UA/M w/rflx Culture, Routine   CBC with Differential/Platelet   CMP14+EGFR   Lipid Profile   Vitamin D (25 hydroxy)   B12 and Folate Panel   Iron, TIBC and Ferritin Panel   TSH + free T4    Meds ordered this encounter  Medications   escitalopram (LEXAPRO) 10 MG tablet    Sig: Take 1 tablet (10 mg total) by mouth daily.    Dispense:  30 tablet    Refill:  2   ALPRAZolam (XANAX) 0.5 MG tablet    Sig: Take 1 tablet (0.5 mg total) by mouth at bedtime as needed for anxiety or sleep.    Dispense:  30 tablet    Refill:  0    Return in about 1 month (around 04/27/2022) for F/U, eval new med, Labs, Micha Dosanjh PCP.   Total time spent:30 Minutes Time spent includes review of chart, medications, test results, and follow up plan with the patient.   [place] Controlled Substance Database was reviewed by me.  This patient was seen by Jonetta Osgood, FNP-C in collaboration with Dr. Clayborn Bigness as a part of collaborative care agreement.  Annaliz Aven R. Valetta Fuller, MSN, FNP-C Internal medicine

## 2022-03-29 LAB — UA/M W/RFLX CULTURE, ROUTINE
Bilirubin, UA: NEGATIVE
Glucose, UA: NEGATIVE
Ketones, UA: NEGATIVE
Leukocytes,UA: NEGATIVE
Nitrite, UA: NEGATIVE
Protein,UA: NEGATIVE
Specific Gravity, UA: 1.021 (ref 1.005–1.030)
Urobilinogen, Ur: 0.2 mg/dL (ref 0.2–1.0)
pH, UA: 6 (ref 5.0–7.5)

## 2022-03-29 LAB — MICROSCOPIC EXAMINATION
Bacteria, UA: NONE SEEN
Casts: NONE SEEN /lpf
Epithelial Cells (non renal): NONE SEEN /hpf (ref 0–10)
WBC, UA: NONE SEEN /hpf (ref 0–5)

## 2022-03-30 LAB — LIPID PANEL
Chol/HDL Ratio: 5.8 ratio — ABNORMAL HIGH (ref 0.0–5.0)
Cholesterol, Total: 181 mg/dL (ref 100–199)
HDL: 31 mg/dL — ABNORMAL LOW (ref >39)
LDL Chol Calc (NIH): 104 mg/dL — ABNORMAL HIGH (ref 0–99)
Triglycerides: 269 mg/dL — ABNORMAL HIGH (ref 0–149)
VLDL Cholesterol Cal: 46 mg/dL — ABNORMAL HIGH (ref 5–40)

## 2022-03-30 LAB — CMP14+EGFR
ALT: 16 IU/L (ref 0–44)
AST: 14 IU/L (ref 0–40)
Albumin/Globulin Ratio: 2 (ref 1.2–2.2)
Albumin: 5.1 g/dL — ABNORMAL HIGH (ref 4.0–5.0)
Alkaline Phosphatase: 82 IU/L (ref 44–121)
BUN/Creatinine Ratio: 12 (ref 9–20)
BUN: 11 mg/dL (ref 6–20)
Bilirubin Total: 0.9 mg/dL (ref 0.0–1.2)
CO2: 23 mmol/L (ref 20–29)
Calcium: 9.9 mg/dL (ref 8.7–10.2)
Chloride: 100 mmol/L (ref 96–106)
Creatinine, Ser: 0.93 mg/dL (ref 0.76–1.27)
Globulin, Total: 2.5 g/dL (ref 1.5–4.5)
Glucose: 102 mg/dL — ABNORMAL HIGH (ref 70–99)
Potassium: 4.1 mmol/L (ref 3.5–5.2)
Sodium: 140 mmol/L (ref 134–144)
Total Protein: 7.6 g/dL (ref 6.0–8.5)
eGFR: 112 mL/min/1.73

## 2022-03-30 LAB — CBC WITH DIFFERENTIAL/PLATELET
Basophils Absolute: 0.1 10*3/uL (ref 0.0–0.2)
Basos: 1 %
EOS (ABSOLUTE): 0.3 10*3/uL (ref 0.0–0.4)
Eos: 3 %
Hematocrit: 45.6 % (ref 37.5–51.0)
Hemoglobin: 16.1 g/dL (ref 13.0–17.7)
Immature Grans (Abs): 0 10*3/uL (ref 0.0–0.1)
Immature Granulocytes: 0 %
Lymphocytes Absolute: 1.9 10*3/uL (ref 0.7–3.1)
Lymphs: 24 %
MCH: 29.9 pg (ref 26.6–33.0)
MCHC: 35.3 g/dL (ref 31.5–35.7)
MCV: 85 fL (ref 79–97)
Monocytes Absolute: 0.6 10*3/uL (ref 0.1–0.9)
Monocytes: 8 %
Neutrophils Absolute: 5.2 10*3/uL (ref 1.4–7.0)
Neutrophils: 64 %
Platelets: 266 10*3/uL (ref 150–450)
RBC: 5.39 x10E6/uL (ref 4.14–5.80)
RDW: 11.8 % (ref 11.6–15.4)
WBC: 8.1 10*3/uL (ref 3.4–10.8)

## 2022-03-30 LAB — TSH+FREE T4
Free T4: 1.37 ng/dL (ref 0.82–1.77)
TSH: 1.48 u[IU]/mL (ref 0.450–4.500)

## 2022-03-30 LAB — IRON,TIBC AND FERRITIN PANEL
Ferritin: 375 ng/mL (ref 30–400)
Iron Saturation: 36 % (ref 15–55)
Iron: 105 ug/dL (ref 38–169)
Total Iron Binding Capacity: 290 ug/dL (ref 250–450)
UIBC: 185 ug/dL (ref 111–343)

## 2022-03-30 LAB — B12 AND FOLATE PANEL
Folate: 12.4 ng/mL (ref >3.0)
Vitamin B-12: 359 pg/mL (ref 232–1245)

## 2022-03-30 LAB — VITAMIN D 25 HYDROXY (VIT D DEFICIENCY, FRACTURES): Vit D, 25-Hydroxy: 32 ng/mL (ref 30.0–100.0)

## 2022-03-31 ENCOUNTER — Encounter: Payer: Self-pay | Admitting: Nurse Practitioner

## 2022-04-05 ENCOUNTER — Telehealth: Payer: Self-pay

## 2022-04-06 NOTE — Telephone Encounter (Signed)
Lmom that he can take  lexapro at bedtime and we will call back Monday for labs and also send message to my chart

## 2022-04-11 NOTE — Telephone Encounter (Signed)
Pt advised to labs for labs he like to wait for  chol med he will do exercise and we recheck in 6 month

## 2022-04-26 ENCOUNTER — Ambulatory Visit: Payer: Managed Care, Other (non HMO) | Admitting: Nurse Practitioner

## 2022-05-02 ENCOUNTER — Ambulatory Visit: Payer: Managed Care, Other (non HMO) | Admitting: Nurse Practitioner

## 2022-05-02 ENCOUNTER — Encounter: Payer: Self-pay | Admitting: Nurse Practitioner

## 2022-05-02 ENCOUNTER — Telehealth: Payer: Self-pay

## 2022-05-02 VITALS — BP 123/71 | HR 87 | Temp 98.5°F | Resp 16 | Ht 69.0 in | Wt 234.8 lb

## 2022-05-02 DIAGNOSIS — F5104 Psychophysiologic insomnia: Secondary | ICD-10-CM | POA: Diagnosis not present

## 2022-05-02 DIAGNOSIS — E782 Mixed hyperlipidemia: Secondary | ICD-10-CM | POA: Diagnosis not present

## 2022-05-02 DIAGNOSIS — F411 Generalized anxiety disorder: Secondary | ICD-10-CM

## 2022-05-02 DIAGNOSIS — R002 Palpitations: Secondary | ICD-10-CM

## 2022-05-02 MED ORDER — ALPRAZOLAM 0.5 MG PO TABS
0.5000 mg | ORAL_TABLET | Freq: Every evening | ORAL | 0 refills | Status: DC | PRN
Start: 2022-05-02 — End: 2022-12-03

## 2022-05-02 MED ORDER — OMEGA-3-ACID ETHYL ESTERS 1 G PO CAPS: 2.0000 g | ORAL_CAPSULE | Freq: Every day | ORAL | 2 refills | Status: DC
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
## 2022-05-02 NOTE — Telephone Encounter (Signed)
SS order placed in [facility] folder-Toni 

## 2022-05-02 NOTE — Patient Instructions (Signed)
If the prescription fish oil supplement is not covered, please take 1-2 capsules daily of an over the counter flaxseed oil, fish oil or krill oil supplement.  Take 1000-2000 units of vitamin D3 daily (over the counter)  I recommend taking 1000 mcg of vitamin B 12 daily (over the counter)

## 2022-05-08 ENCOUNTER — Encounter: Payer: Self-pay | Admitting: Cardiovascular Disease

## 2022-05-09 ENCOUNTER — Ambulatory Visit (INDEPENDENT_AMBULATORY_CARE_PROVIDER_SITE_OTHER): Payer: Managed Care, Other (non HMO)

## 2022-05-09 ENCOUNTER — Ambulatory Visit (INDEPENDENT_AMBULATORY_CARE_PROVIDER_SITE_OTHER): Payer: Managed Care, Other (non HMO) | Admitting: Cardiovascular Disease

## 2022-05-09 ENCOUNTER — Encounter: Payer: Self-pay | Admitting: Cardiovascular Disease

## 2022-05-09 VITALS — BP 112/72 | HR 75 | Ht 69.0 in | Wt 233.0 lb

## 2022-05-09 DIAGNOSIS — R002 Palpitations: Secondary | ICD-10-CM

## 2022-05-09 DIAGNOSIS — Z720 Tobacco use: Secondary | ICD-10-CM | POA: Diagnosis not present

## 2022-05-09 DIAGNOSIS — F419 Anxiety disorder, unspecified: Secondary | ICD-10-CM | POA: Diagnosis not present

## 2022-05-09 NOTE — Patient Instructions (Addendum)
Medication Instructions:  No changes  If you need a refill on your cardiac medications before your next appointment, please call your pharmacy.   Lab work: No new labs needed  Testing/Procedures:  Your provider has ordered a heart monitor to wear for 14 days. This will be mailed to your home with instructions on placement. Once you have finished the time frame requested, you will return monitor in box provided.     Follow-Up: At [facility], you and your health needs are our priority.  As part of our continuing mission to provide you with exceptional heart care, we have created designated Provider Care Teams.  These Care Teams include your primary Cardiologist (physician) and Advanced Practice Providers (APPs -  Physician Assistants and Nurse Practitioners) who all work together to provide you with the care you need, when you need it.  You will need a follow up appointment as needed  Providers on your designated Care Team:   Nicolasa Ducking, NP Eula Listen, PA-C Cadence Fransico Michael, [place]  COVID-19 Vaccine Information can be found at: [email or web address] For questions related to vaccine distribution or appointments, please email [email or web address] or call [phone].

## 2022-05-09 NOTE — Progress Notes (Signed)
Cardiology Office Note  Date:  05/09/2022   ID:  Alfred Gross, DOB 12-10-1990, MRN [id number]  PCP:  Alfred Kuster, NP   Chief Complaint  Patient presents with   New Patient (Initial Visit)    Referred by PCP for palpitations. Patient reports that he does have bad anxiety. Meds reviewed verbally with patient.     HPI:  [name] is a 31 year old male with past medical history of Anxiety  Kidney stones smoking who presents by referral from Alfred Gross for consultation of his palpitations  Reports palpitations started January 2023, was initially infrequent now getting more frequent Typically presents in the afternoon 3 PM and can continue until bedtime  Seen in the emergency room at [facility] April 27, 2022 for palpitations Work-up they are essentially negative and was discharged with follow-up with cardiology Reports when he has the palpitations, gets very nervous  Episode yesterday, Started 3pm until bed time  Unable to identify any triggers  EKG personally reviewed by myself on todays visit Normal sinus rhythm rate 75 bpm no significant ST or T wave changes  PMH:   has a past medical history of Anxiety, Anxiety, Family history of colon cancer, Family history of leukemia, Family history of Lynch syndrome, and Family history of prostate cancer.  PSH:    Past Surgical History:  Procedure Laterality Date   no surgical history      Current Outpatient Medications  Medication Sig Dispense Refill   ALPRAZolam (XANAX) 0.5 MG tablet Take 1 tablet (0.5 mg total) by mouth at bedtime as needed for anxiety or sleep. 60 tablet 0   escitalopram (LEXAPRO) 10 MG tablet Take 1 tablet (10 mg total) by mouth daily. 30 tablet 2   omega-3 acid ethyl esters (LOVAZA) 1 g capsule Take 2 capsules (2 g total) by mouth daily. 60 capsule 2   pantoprazole (PROTONIX) 40 MG tablet TAKE 1 TABLET(40 MG) BY MOUTH TWICE DAILY BEFORE A MEAL 180 tablet 1   vitamin B-12  (CYANOCOBALAMIN) 1000 MCG tablet Take 1,000 mcg by mouth daily.     Vitamin D3 (VITAMIN D) 25 MCG tablet Take 1,000 Units by mouth daily.     No current facility-administered medications for this visit.     Allergies:   Penicillins   Social History:  The patient  reports that he has been smoking cigarettes. He has never used smokeless tobacco. He reports that he does not drink alcohol and does not use drugs.   Family History:   family history includes Colon cancer in his father and paternal grandfather; Colon cancer (age of onset: 35) in his half-brother; Colon cancer (age of onset: 60) in his paternal uncle; Healthy in his mother; Leukemia (age of onset: 2) in his half-brother; Prostate cancer in his paternal grandfather.    Review of Systems: Review of Systems  Constitutional: Negative.   HENT: Negative.    Respiratory: Negative.    Cardiovascular:  Positive for palpitations.  Gastrointestinal: Negative.   Musculoskeletal: Negative.   Neurological: Negative.   Psychiatric/Behavioral: Negative.    All other systems reviewed and are negative.   PHYSICAL EXAM: VS:  BP 112/72 (BP Location: Left Arm, Patient Position: Sitting, Cuff Size: Normal)   Pulse 75   Ht 5\' 9"  (1.753 m)   Wt 233 lb (105.7 kg)   BMI 34.41 kg/m  , BMI Body mass index is 34.41 kg/m. GEN: Well nourished, well developed, in no acute distress HEENT: normal Neck: no JVD, carotid bruits, or masses Cardiac:  RRR; no murmurs, rubs, or gallops,no edema  Respiratory:  clear to auscultation bilaterally, normal work of breathing GI: soft, nontender, nondistended, + BS MS: no deformity or atrophy Skin: warm and dry, no rash Neuro:  Strength and sensation are intact Psych: euthymic mood, full affect    Recent Labs: 03/29/2022: ALT 16; BUN 11; Creatinine, Ser 0.93; Hemoglobin 16.1; Platelets 266; Potassium 4.1; Sodium 140; TSH 1.480    Lipid Panel Lab Results  Component Value Date   CHOL 181 03/29/2022   HDL  31 (L) 03/29/2022   LDLCALC 104 (H) 03/29/2022   TRIG 269 (H) 03/29/2022      Wt Readings from Last 3 Encounters:  05/09/22 233 lb (105.7 kg)  05/02/22 234 lb 12.8 oz (106.5 kg)  03/28/22 238 lb (108 kg)     ASSESSMENT AND PLAN:  Problem List Items Addressed This Visit     Anxiety   Tobacco use   Other Visit Diagnoses     Palpitations    -  Primary   Relevant Orders   EKG 12-Lead   LONG TERM MONITOR (3-14 DAYS)      Palpitations Etiology unclear though concerning for ectopy/PVCs or other Recommended a Zio monitor for further evaluation Recommend he stay hydrated in the afternoons, consider adding electrolytes to his drinks, discussed role of adding potassium/magnesium Less likely underlying structural heart disease with normal clinical exam, otherwise normal EKG though for high ectopy burden, echocardiogram could be considered Less likely ischemia though smoking cessation has been recommended  Smoker We have encouraged him to continue to work on weaning his cigarettes and smoking cessation. He will continue to work on this and does not want any assistance with chantix.    Elevated triglycerides We have encouraged continued exercise, careful diet management in an effort to lose weight.  GERD On PPI twice daily   Total encounter time more than 60 minutes  Greater than 50% was spent in counseling and coordination of care with the patient  Patient was seen in consultation for [facility] and will be referred back to her office for ongoing care of the issues detailed above  Signed, Alfred Gross, M.D., Ph.D. [facility] [place], [place] [phone]

## 2022-05-11 DIAGNOSIS — R002 Palpitations: Secondary | ICD-10-CM | POA: Diagnosis not present

## 2022-05-14 ENCOUNTER — Encounter: Payer: Self-pay | Admitting: Cardiovascular Disease

## 2022-05-15 ENCOUNTER — Encounter: Payer: Self-pay | Admitting: Nurse Practitioner

## 2022-05-21 ENCOUNTER — Other Ambulatory Visit: Payer: Self-pay

## 2022-05-21 MED ORDER — OMEPRAZOLE 40 MG PO CPDR: 40.0000 mg | DELAYED_RELEASE_CAPSULE | Freq: Two times a day (BID) | ORAL | 1 refills | Status: DC

## 2022-05-21 MED ORDER — LANSOPRAZOLE 30 MG PO CPDR: 30.0000 mg | DELAYED_RELEASE_CAPSULE | Freq: Two times a day (BID) | ORAL | 1 refills | Status: DC

## 2022-05-21 NOTE — Telephone Encounter (Signed)
Pt called Friday and was asking if Alyssa could change his pantoprazole to something else.  He feels that it is causing him palpitations.  He stopped that medication for a few days and the palpitations stopped.  Per Alyssa she changed it to LANSOPRAZOLE 30 mg twice a day .  Alyssa advised for pt to start with one capsule first and see how it is working and them he can go to two capsules if needed.  Called pt and LMOM that we changed medication and the I sent to his pharmacy

## 2022-05-22 ENCOUNTER — Other Ambulatory Visit: Payer: Self-pay

## 2022-05-22 DIAGNOSIS — F411 Generalized anxiety disorder: Secondary | ICD-10-CM

## 2022-05-22 MED ORDER — ESCITALOPRAM OXALATE 10 MG PO TABS: 10.0000 mg | ORAL_TABLET | Freq: Every day | ORAL | 1 refills | Status: DC

## 2022-05-29 ENCOUNTER — Ambulatory Visit: Payer: Managed Care, Other (non HMO) | Admitting: Nurse Practitioner

## 2022-06-04 ENCOUNTER — Ambulatory Visit: Payer: Managed Care, Other (non HMO) | Admitting: Nurse Practitioner

## 2022-06-04 ENCOUNTER — Encounter: Payer: Self-pay | Admitting: Nurse Practitioner

## 2022-06-04 VITALS — BP 126/84 | HR 96 | Temp 98.4°F | Resp 16 | Ht 69.0 in | Wt 236.6 lb

## 2022-06-04 DIAGNOSIS — F411 Generalized anxiety disorder: Secondary | ICD-10-CM | POA: Diagnosis not present

## 2022-06-04 DIAGNOSIS — R002 Palpitations: Secondary | ICD-10-CM

## 2022-06-04 DIAGNOSIS — G4710 Hypersomnia, unspecified: Secondary | ICD-10-CM | POA: Diagnosis not present

## 2022-06-04 NOTE — Progress Notes (Signed)
[facility] [place], [place] [address]  Internal MEDICINE  Office Visit Note  Patient Name: Alfred Gross  [id number]  [id number]  Date of Service: 06/04/2022  Chief Complaint  Patient presents with   Follow-up    Sleep study   Anxiety    HPI Alfred Gross presents for a follow up visit for anxiety, sleep disturbance and palpitations.  Trouble sleeping, snoring, excessively sleepy most of the day, insurance would not cover the sleep study at the sleep clinic. Requesting home sleep test. Anxiety -- feeling better on lexapro instead of celexa. Still has prn alprazolam for severe anxiety or panic attacks.  Taking omeprazole for GERD now. GERD symptoms are better controlled and palpitations are improving and decreasing in frequency.    Current Medication: Outpatient Encounter Medications as of 06/04/2022  Medication Sig   ALPRAZolam (XANAX) 0.5 MG tablet Take 1 tablet (0.5 mg total) by mouth at bedtime as needed for anxiety or sleep.   escitalopram (LEXAPRO) 10 MG tablet Take 1 tablet (10 mg total) by mouth daily.   omega-3 acid ethyl esters (LOVAZA) 1 g capsule Take 2 capsules (2 g total) by mouth daily.   omeprazole (PRILOSEC) 40 MG capsule Take 1 capsule (40 mg total) by mouth in the morning and at bedtime.   vitamin B-12 (CYANOCOBALAMIN) 1000 MCG tablet Take 1,000 mcg by mouth daily.   Vitamin D3 (VITAMIN D) 25 MCG tablet Take 1,000 Units by mouth daily.   No facility-administered encounter medications on file as of 06/04/2022.    Surgical History: Past Surgical History:  Procedure Laterality Date   no surgical history      Medical History: Past Medical History:  Diagnosis Date   Anxiety    Anxiety    Family history of colon cancer    Family history of leukemia    Family history of Lynch syndrome    Family history of prostate cancer     Family History: Family History  Problem Relation Age of Onset   Healthy Mother    Colon cancer  Father        MSH2+ Lynch syndrome, CHEK2+   Colon cancer Paternal Uncle 58   Colon cancer Paternal Grandfather        "Lynch+"   Prostate cancer Paternal Grandfather        dx 31s   Leukemia Half-Brother 66   Colon cancer Half-Brother 52       "Lynch+"    Social History   Socioeconomic History   Marital status: Married    Spouse name: Not on file   Number of children: Not on file   Years of education: Not on file   Highest education level: Not on file  Occupational History   Not on file  Tobacco Use   Smoking status: Every Day    Years: 10.00    Types: Cigarettes    Last attempt to quit: 11/28/2016    Years since quitting: 5.6   Smokeless tobacco: Never   Tobacco comments:    1/2 pack-1 pack daily  Vaping Use   Vaping Use: Never used  Substance and Sexual Activity   Alcohol use: No   Drug use: No   Sexual activity: Not on file  Other Topics Concern   Not on file  Social History Narrative   Not on file   Social Determinants of Health   Financial Resource Strain: Not on file  Food Insecurity: Not on file  Transportation Needs: Not on file  Physical Activity: Not on file  Stress: Not on file  Social Connections: Not on file  Intimate Partner Violence: Not on file      Review of Systems  Constitutional:  Positive for fatigue. Negative for fever.  HENT:  Negative for congestion, mouth sores and postnasal drip.   Respiratory:  Negative for cough and shortness of breath.   Cardiovascular:  Positive for palpitations (decreasing, still have palpitations if anxiety level is high or having a panic attack. no longer having the issue at rest since he is no longer taking pantoprazole.). Negative for chest pain and leg swelling.  Genitourinary:  Negative for flank pain.  Psychiatric/Behavioral:  Positive for sleep disturbance. The patient is nervous/anxious.     Vital Signs: BP 126/84   Pulse 96   Temp 98.4 F (36.9 C)   Resp 16   Ht 5' 9"  (1.753 m)   Wt 236 lb  9.6 oz (107.3 kg)   SpO2 96%   BMI 34.94 kg/m    Physical Exam Vitals reviewed.  Constitutional:      General: He is not in acute distress.    Appearance: Normal appearance. He is obese. He is not ill-appearing.  HENT:     Head: Normocephalic and atraumatic.  Eyes:     Pupils: Pupils are equal, round, and reactive to light.  Cardiovascular:     Rate and Rhythm: Normal rate and regular rhythm.  Pulmonary:     Effort: Pulmonary effort is normal. No respiratory distress.  Neurological:     Mental Status: He is alert and oriented to person, place, and time.  Psychiatric:        Mood and Affect: Mood normal.        Behavior: Behavior normal.        Assessment/Plan: 1. Hypersomnia Home sleep test ordered. Follow up to discuss results - Home sleep test  2. Palpitations Improving, no longer having any at rest since he is off pantoprazole. No further intervention at this time  3. GAD (generalized anxiety disorder) Anxiety better controlled with lexapro. Continue lexapro and prn alprazolam as prescribed   General Counseling: Alfred Gross verbalizes understanding of the findings of todays visit and agrees with plan of treatment. I have discussed any further diagnostic evaluation that may be needed or ordered today. We also reviewed his medications today. he has been encouraged to call the office with any questions or concerns that should arise related to todays visit.    Orders Placed This Encounter  Procedures   Home sleep test    No orders of the defined types were placed in this encounter.   Return in about 3 months (around 09/04/2022) for F/U, anxiety med refill, Kiley Torrence PCP also need F/U for SS results.   Total time spent:30 Minutes Time spent includes review of chart, medications, test results, and follow up plan with the patient.   [place] Controlled Substance Database was reviewed by me.  This patient was seen by Jonetta Osgood, FNP-C in collaboration with Dr.  Clayborn Bigness as a part of collaborative care agreement.   Jasma Seevers R. Valetta Fuller, MSN, FNP-C Internal medicine

## 2022-06-17 ENCOUNTER — Encounter: Payer: Self-pay | Admitting: Nurse Practitioner

## 2022-06-25 ENCOUNTER — Telehealth: Payer: Self-pay

## 2022-06-25 NOTE — Telephone Encounter (Signed)
SS appointment>>06/22/22-Toni

## 2022-06-25 NOTE — Telephone Encounter (Signed)
Patient declined to have SS done. He not able to pay $1,000 for testing-Toni

## 2022-06-27 ENCOUNTER — Ambulatory Visit: Payer: Managed Care, Other (non HMO) | Admitting: Cardiovascular Disease

## 2022-07-21 ENCOUNTER — Encounter: Payer: Self-pay | Admitting: Nurse Practitioner

## 2022-08-09 ENCOUNTER — Encounter: Payer: Self-pay | Admitting: Nurse Practitioner

## 2022-08-09 ENCOUNTER — Telehealth: Payer: Managed Care, Other (non HMO) | Admitting: Nurse Practitioner

## 2022-08-09 VITALS — Resp 16 | Ht 69.0 in

## 2022-08-09 DIAGNOSIS — J0110 Acute frontal sinusitis, unspecified: Secondary | ICD-10-CM | POA: Diagnosis not present

## 2022-08-09 MED ORDER — AZITHROMYCIN 250 MG PO TABS: ORAL_TABLET | ORAL | 0 refills | Status: AC

## 2022-08-09 NOTE — Progress Notes (Signed)
[facility] [place], [place] [address]  Internal MEDICINE  Telephone Visit  Patient Name: Alfred Gross  [id number]  [id number]  Date of Service: 08/09/2022  I connected with the patient at [facility] by telephone and verified the patients identity using two identifiers.   I discussed the limitations, risks, security and privacy concerns of performing an evaluation and management service by telephone and the availability of in person appointments. I also discussed with the patient that there may be a patient responsible charge related to the service.  The patient expressed understanding and agrees to proceed.    Chief Complaint  Patient presents with   Telephone Screen    Pressure above nose and forehead.    Telephone Assessment    [phone]    HPI Shawna presents for a telehealth virtual visit for sx of symptoms. Neg for covid Sx onset 5 -6 days ago Nose spray not helping.  Zyrtec not helping Drainage is yellow, also has sinus pressure, cheeks and forehead     Current Medication: Outpatient Encounter Medications as of 08/09/2022  Medication Sig   ALPRAZolam (XANAX) 0.5 MG tablet Take 1 tablet (0.5 mg total) by mouth at bedtime as needed for anxiety or sleep.   azithromycin (ZITHROMAX) 250 MG tablet Take 2 tablets on day 1, then 1 tablet daily on days 2 through 5   escitalopram (LEXAPRO) 10 MG tablet Take 1 tablet (10 mg total) by mouth daily.   omega-3 acid ethyl esters (LOVAZA) 1 g capsule Take 2 capsules (2 g total) by mouth daily.   omeprazole (PRILOSEC) 40 MG capsule Take 1 capsule (40 mg total) by mouth in the morning and at bedtime.   vitamin B-12 (CYANOCOBALAMIN) 1000 MCG tablet Take 1,000 mcg by mouth daily.   Vitamin D3 (VITAMIN D) 25 MCG tablet Take 1,000 Units by mouth daily.   No facility-administered encounter medications on file as of 08/09/2022.    Surgical History: Past Surgical History:  Procedure Laterality Date    no surgical history      Medical History: Past Medical History:  Diagnosis Date   Anxiety    Anxiety    Family history of colon cancer    Family history of leukemia    Family history of Lynch syndrome    Family history of prostate cancer     Family History: Family History  Problem Relation Age of Onset   Healthy Mother    Colon cancer Father        MSH2+ Lynch syndrome, CHEK2+   Colon cancer Paternal Uncle 77   Colon cancer Paternal Grandfather        "Lynch+"   Prostate cancer Paternal Grandfather        dx 28s   Leukemia Half-Brother 69   Colon cancer Half-Brother 10       "Lynch+"    Social History   Socioeconomic History   Marital status: Married    Spouse name: Not on file   Number of children: Not on file   Years of education: Not on file   Highest education level: Not on file  Occupational History   Not on file  Tobacco Use   Smoking status: Every Day    Years: 10.00    Types: Cigarettes    Last attempt to quit: 11/28/2016    Years since quitting: 5.6   Smokeless tobacco: Never   Tobacco comments:    1/2 pack-1 pack daily  Vaping Use   Vaping Use: Never  used  Substance and Sexual Activity   Alcohol use: No   Drug use: No   Sexual activity: Not on file  Other Topics Concern   Not on file  Social History Narrative   Not on file   Social Determinants of Health   Financial Resource Strain: Not on file  Food Insecurity: Not on file  Transportation Needs: Not on file  Physical Activity: Not on file  Stress: Not on file  Social Connections: Not on file  Intimate Partner Violence: Not on file      Review of Systems  Constitutional:  Positive for fatigue.  HENT:  Positive for congestion, postnasal drip, rhinorrhea, sinus pressure and sinus pain. Negative for ear pain (pressure not pain) and sore throat.   Respiratory:  Positive for cough. Negative for chest tightness, shortness of breath and wheezing.   Cardiovascular: Negative.  Negative for  chest pain and palpitations.    Vital Signs: Resp 16   Ht 5' 9" (1.753 m)   BMI 34.94 kg/m    Observation/Objective: He is alert and oriented and engages in conversation appropriately.  He does not sound like he is in any acute distress over telephone call.    Assessment/Plan: 1. Acute non-recurrent frontal sinusitis Empiric antibiotic treatment prescribed - azithromycin (ZITHROMAX) 250 MG tablet; Take 2 tablets on day 1, then 1 tablet daily on days 2 through 5  Dispense: 6 tablet; Refill: 0   General Counseling: Katrell verbalizes understanding of the findings of today's phone visit and agrees with plan of treatment. I have discussed any further diagnostic evaluation that may be needed or ordered today. We also reviewed his medications today. he has been encouraged to call the office with any questions or concerns that should arise related to todays visit.  Return if symptoms worsen or fail to improve.   No orders of the defined types were placed in this encounter.   Meds ordered this encounter  Medications   azithromycin (ZITHROMAX) 250 MG tablet    Sig: Take 2 tablets on day 1, then 1 tablet daily on days 2 through 5    Dispense:  6 tablet    Refill:  0    Time spent:10 Minutes Time spent with patient included reviewing progress notes, labs, imaging studies, and discussing plan for follow up.  [place] Controlled Substance Database was reviewed by me for overdose risk score (ORS) if appropriate.  This patient was seen by Jonetta Osgood, FNP-C in collaboration with Dr. Clayborn Bigness as a part of collaborative care agreement.  Tauna Macfarlane R. Valetta Fuller, MSN, FNP-C Internal medicine

## 2022-08-14 ENCOUNTER — Other Ambulatory Visit: Payer: Self-pay

## 2022-08-14 DIAGNOSIS — E782 Mixed hyperlipidemia: Secondary | ICD-10-CM

## 2022-10-09 ENCOUNTER — Encounter: Payer: Self-pay | Admitting: Nurse Practitioner

## 2022-10-09 ENCOUNTER — Telehealth: Payer: Managed Care, Other (non HMO) | Admitting: Nurse Practitioner

## 2022-10-09 VITALS — Resp 16 | Ht 69.0 in | Wt 234.0 lb

## 2022-10-09 DIAGNOSIS — J0110 Acute frontal sinusitis, unspecified: Secondary | ICD-10-CM

## 2022-10-09 MED ORDER — DOXYCYCLINE HYCLATE 100 MG PO TABS: 100.0000 mg | ORAL_TABLET | Freq: Two times a day (BID) | ORAL | 0 refills | Status: DC

## 2022-10-09 MED ORDER — FLUTICASONE PROPIONATE 50 MCG/ACT NA SUSP: 2.0000 | Freq: Every day | NASAL | 6 refills | Status: DC

## 2022-10-09 NOTE — Progress Notes (Signed)
[facility] [place], [place] [address]  Internal MEDICINE  Telephone Visit  Patient Name: Alfred Gross  [id number]  [id number]  Date of Service: 10/09/2022  I connected with the patient at 1445 by telephone and verified the patients identity using two identifiers.   I discussed the limitations, risks, security and privacy concerns of performing an evaluation and management service by telephone and the availability of in person appointments. I also discussed with the patient that there may be a patient responsible charge related to the service.  The patient expressed understanding and agrees to proceed.    Chief Complaint  Patient presents with   Telephone Screen    [phone]   Telephone Assessment    Sinus infection, head pressure.    Sore Throat    HPI Alfred Gross presents for a telehealth virtual visit for symptoms of sinusitis.  --reports sore throat and head pressure --onset was a couple of days ago.  --sinus pressure and cough   Current Medication: Outpatient Encounter Medications as of 10/09/2022  Medication Sig   ALPRAZolam (XANAX) 0.5 MG tablet Take 1 tablet (0.5 mg total) by mouth at bedtime as needed for anxiety or sleep.   doxycycline (VIBRA-TABS) 100 MG tablet Take 1 tablet (100 mg total) by mouth 2 (two) times daily.   escitalopram (LEXAPRO) 10 MG tablet Take 1 tablet (10 mg total) by mouth daily.   fluticasone (FLONASE) 50 MCG/ACT nasal spray Place 2 sprays into both nostrils daily.   omega-3 acid ethyl esters (LOVAZA) 1 g capsule Take 2 capsules (2 g total) by mouth daily.   omeprazole (PRILOSEC) 40 MG capsule Take 1 capsule (40 mg total) by mouth in the morning and at bedtime.   vitamin B-12 (CYANOCOBALAMIN) 1000 MCG tablet Take 1,000 mcg by mouth daily.   Vitamin D3 (VITAMIN D) 25 MCG tablet Take 1,000 Units by mouth daily.   No facility-administered encounter medications on file as of 10/09/2022.    Surgical  History: Past Surgical History:  Procedure Laterality Date   no surgical history      Medical History: Past Medical History:  Diagnosis Date   Anxiety    Anxiety    Family history of colon cancer    Family history of leukemia    Family history of Lynch syndrome    Family history of prostate cancer     Family History: Family History  Problem Relation Age of Onset   Healthy Mother    Colon cancer Father        MSH2+ Lynch syndrome, CHEK2+   Colon cancer Paternal Uncle 61   Colon cancer Paternal Grandfather        "Lynch+"   Prostate cancer Paternal Grandfather        dx 41s   Leukemia Half-Brother 15   Colon cancer Half-Brother 70       "Lynch+"    Social History   Socioeconomic History   Marital status: Married    Spouse name: Not on file   Number of children: Not on file   Years of education: Not on file   Highest education level: Not on file  Occupational History   Not on file  Tobacco Use   Smoking status: Every Day    Years: 10.00    Types: Cigarettes    Last attempt to quit: 11/28/2016    Years since quitting: 5.8   Smokeless tobacco: Never   Tobacco comments:    1/2 pack-1 pack daily  Vaping Use  Vaping Use: Never used  Substance and Sexual Activity   Alcohol use: No   Drug use: No   Sexual activity: Not on file  Other Topics Concern   Not on file  Social History Narrative   Not on file   Social Determinants of Health   Financial Resource Strain: Not on file  Food Insecurity: Not on file  Transportation Needs: Not on file  Physical Activity: Not on file  Stress: Not on file  Social Connections: Not on file  Intimate Partner Violence: Not on file      Review of Systems  Constitutional:  Positive for fatigue.  HENT:  Positive for congestion, ear pain, postnasal drip, rhinorrhea, sinus pressure, sinus pain and sore throat.   Respiratory:  Positive for cough and shortness of breath. Negative for chest tightness and wheezing.    Cardiovascular:  Negative for chest pain and palpitations.  Neurological:  Negative for headaches.    Vital Signs: Resp 16   Ht _0  (1.753 m)   Wt 234 lb (106.1 kg)   BMI 34.56 kg/m    Observation/Objective: He is alert and oriented and engages in conversation appropriately. He does not sound as though he is in any acute distress over telephone call.     Assessment/Plan: 1. Acute non-recurrent frontal sinusitis Empiric antibiotic treatment prescribed and flonase reordered - doxycycline (VIBRA-TABS) 100 MG tablet; Take 1 tablet (100 mg total) by mouth 2 (two) times daily.  Dispense: 20 tablet; Refill: 0 - fluticasone (FLONASE) 50 MCG/ACT nasal spray; Place 2 sprays into both nostrils daily.  Dispense: 16 g; Refill: 6   General Counseling: Alfred Gross verbalizes understanding of the findings of today's phone visit and agrees with plan of treatment. I have discussed any further diagnostic evaluation that may be needed or ordered today. We also reviewed his medications today. he has been encouraged to call the office with any questions or concerns that should arise related to todays visit.  Return if symptoms worsen or fail to improve.   No orders of the defined types were placed in this encounter.   Meds ordered this encounter  Medications   doxycycline (VIBRA-TABS) 100 MG tablet    Sig: Take 1 tablet (100 mg total) by mouth 2 (two) times daily.    Dispense:  20 tablet    Refill:  0   fluticasone (FLONASE) 50 MCG/ACT nasal spray    Sig: Place 2 sprays into both nostrils daily.    Dispense:  16 g    Refill:  6    Time spent:10 Minutes Time spent with patient included reviewing progress notes, labs, imaging studies, and discussing plan for follow up.  [place] Controlled Substance Database was reviewed by me for overdose risk score (ORS) if appropriate.  This patient was seen by Jonetta Osgood, FNP-C in collaboration with Dr. Clayborn Bigness as a part of collaborative care  agreement.  Laynee Lockamy R. Valetta Fuller, MSN, FNP-C Internal medicine

## 2022-10-31 IMAGING — CR DG CHEST 2V
1 series · 2 of 2 positions shown · non-contrast
Comparison: 10/27/2014

CLINICAL DATA: Shortness of breath for 2-3 days.

EXAM:
CHEST - 2 VIEW

[Series 1: dg chest 2 view · 0.14mm/px · 2 of 2 slices shown]
[im 1/2]
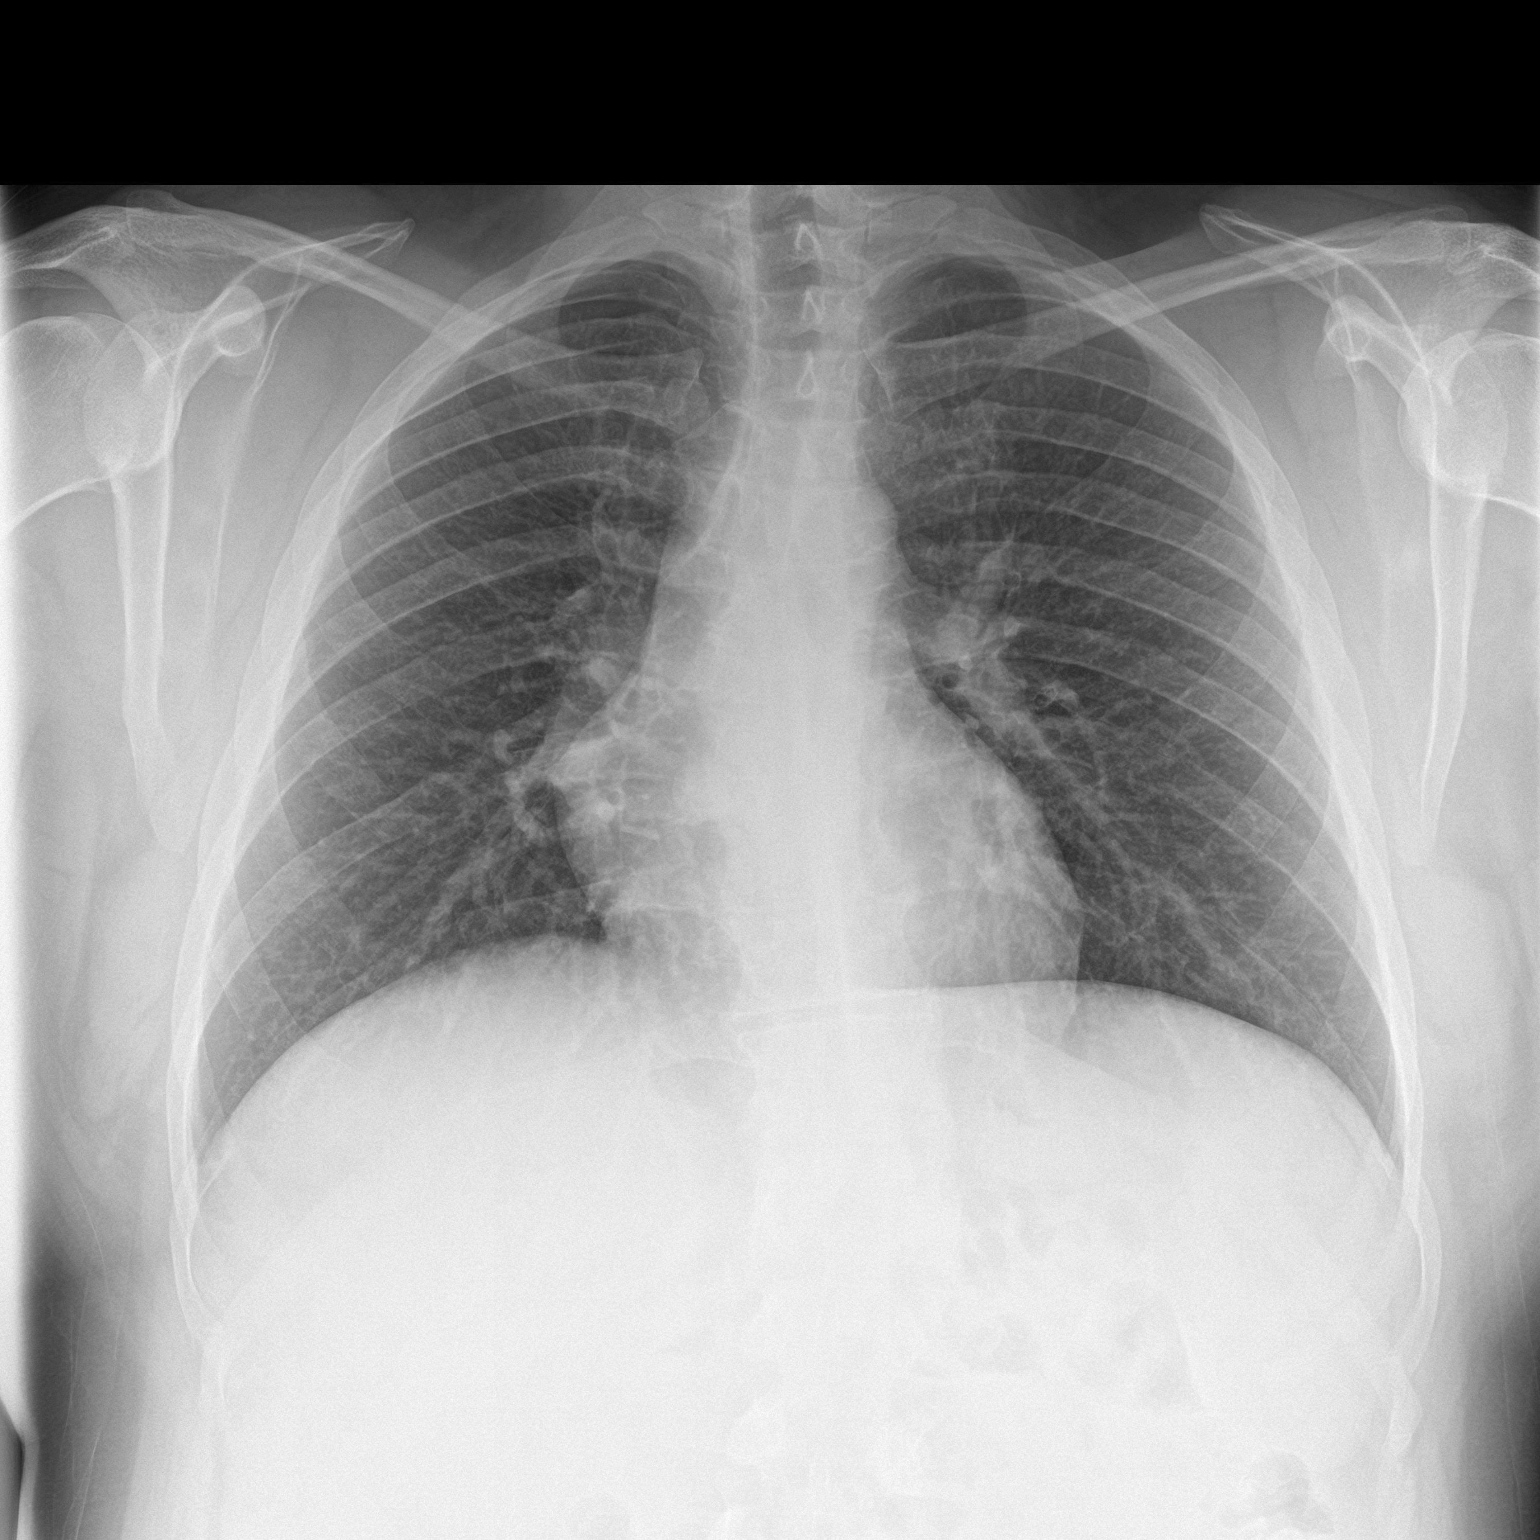
[im 2/2]
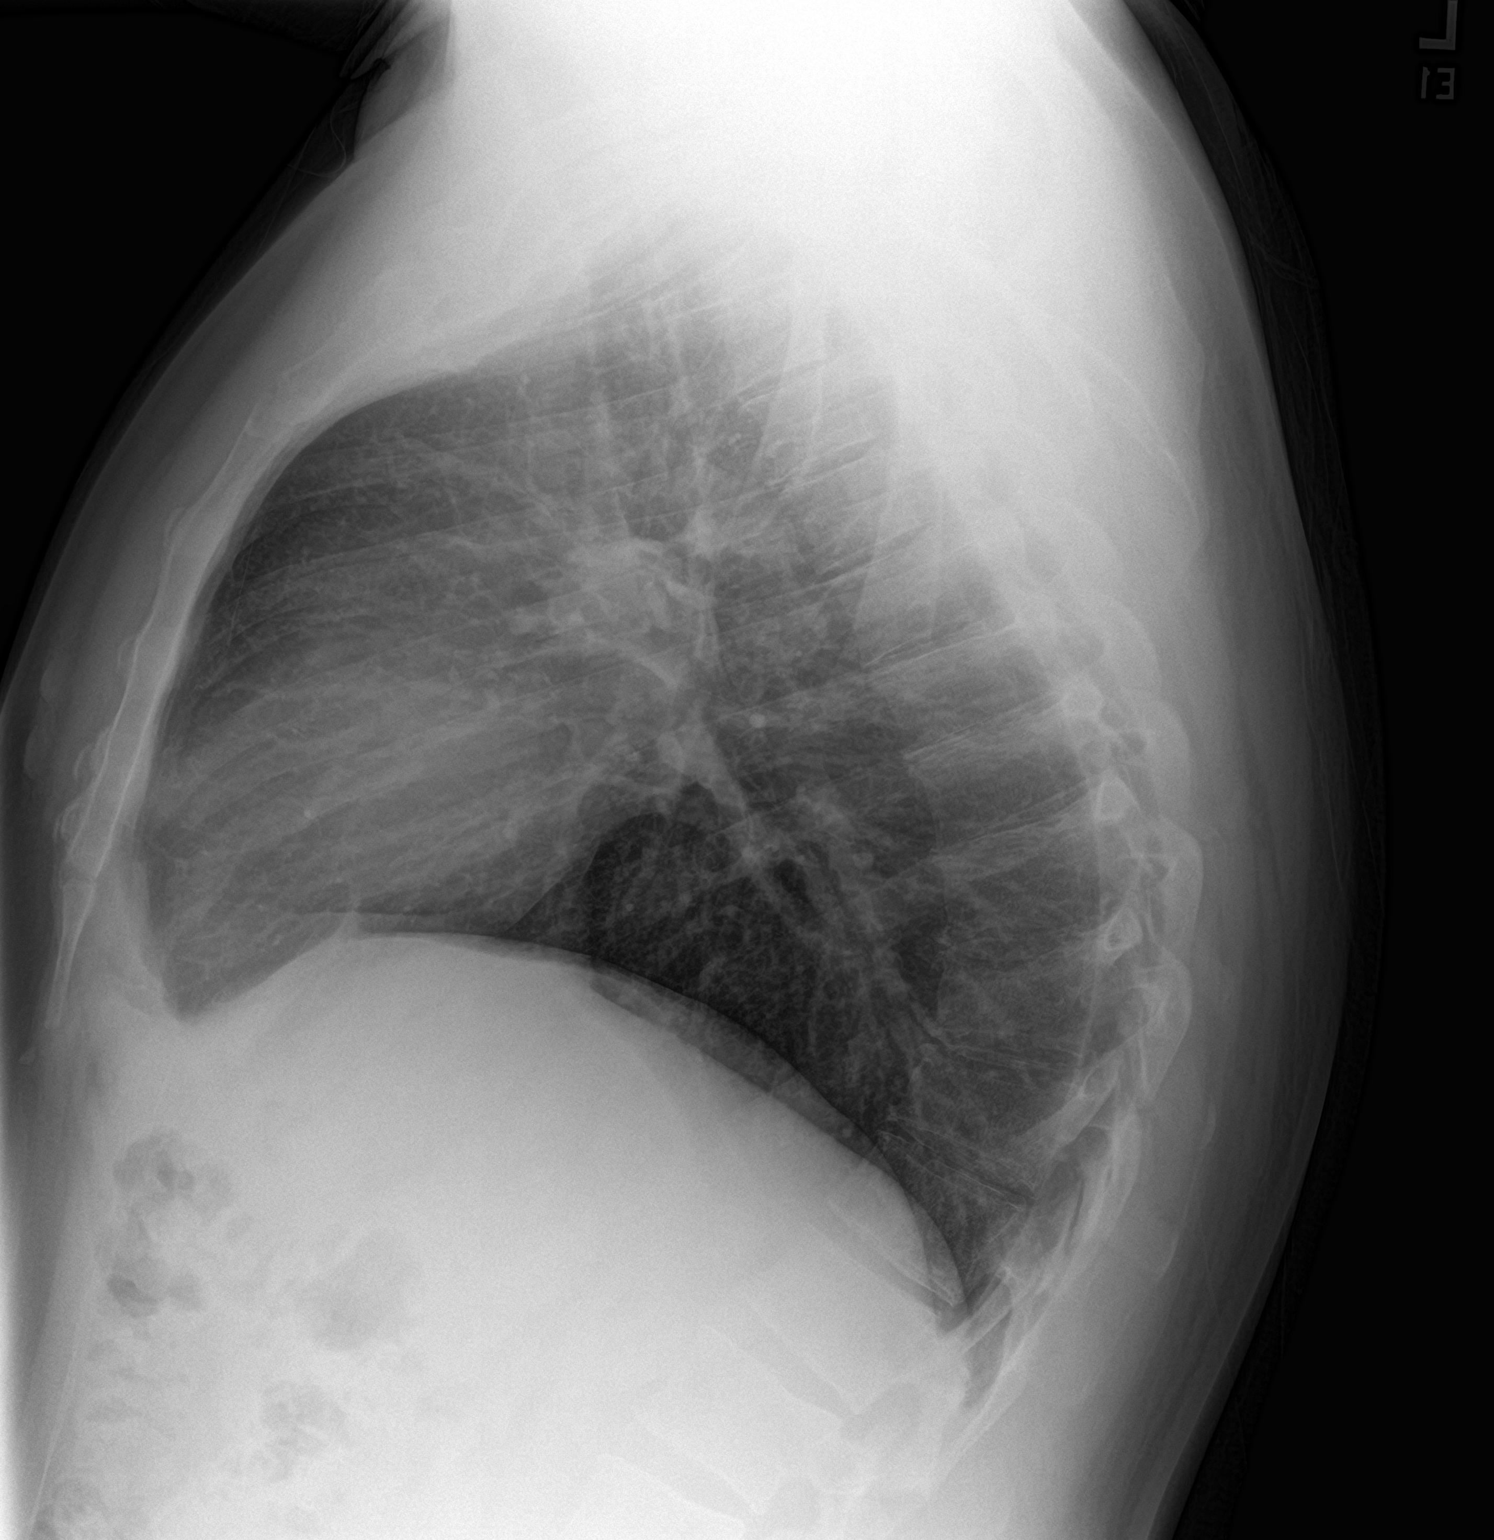

[2 of 2 positions shown; findings below may reference images not displayed]

FINDINGS: The cardiomediastinal contours are normal. Mild bronchial
thickening. Pulmonary vasculature is normal. No consolidation,
pleural effusion, or pneumothorax. No acute osseous abnormalities
are seen.
IMPRESSION: Mild bronchial thickening.

## 2022-11-16 ENCOUNTER — Telehealth: Payer: Managed Care, Other (non HMO) | Admitting: Physician Assistant

## 2022-11-16 ENCOUNTER — Other Ambulatory Visit: Payer: Self-pay

## 2022-11-16 VITALS — Ht 69.0 in | Wt 230.0 lb

## 2022-11-16 DIAGNOSIS — J0110 Acute frontal sinusitis, unspecified: Secondary | ICD-10-CM | POA: Diagnosis not present

## 2022-11-16 MED ORDER — AZITHROMYCIN 250 MG PO TABS: ORAL_TABLET | ORAL | 0 refills | Status: AC

## 2022-11-16 MED ORDER — AZITHROMYCIN 250 MG PO TABS: ORAL_TABLET | ORAL | 0 refills | Status: DC

## 2022-11-16 NOTE — Progress Notes (Signed)
[facility] [place], [place] [address]  Internal MEDICINE  Telephone Visit  Patient Name: Alfred Gross  [id number]  [id number]  Date of Service: 11/16/2022  I connected with the patient at 10:07 by telephone and verified the patients identity using two identifiers.   I discussed the limitations, risks, security and privacy concerns of performing an evaluation and management service by telephone and the availability of in person appointments. I also discussed with the patient that there may be a patient responsible charge related to the service.  The patient expressed understanding and agrees to proceed.    Chief Complaint  Patient presents with   Telephone Assessment    [id number]   Telephone Screen   Sinusitis   Sore Throat    Going for 1 week     HPI Pt is here for virtual sick visit -Has had some sinus congestion for the last month since treated for sinus infection back in Dec. States he hasn't been able to breathe through his nose and is very annoying and leads to dry mouth. -For the past few days now his throat has been really bothering him -A little coughing from the postnasal drip -using nasal spray, zyrtec -Not using mucinex as he states the last time he took it in Dec it didn't help and just made him dry without improving the stuffiness   Current Medication: Outpatient Encounter Medications as of 11/16/2022  Medication Sig   ALPRAZolam (XANAX) 0.5 MG tablet Take 1 tablet (0.5 mg total) by mouth at bedtime as needed for anxiety or sleep.   doxycycline (VIBRA-TABS) 100 MG tablet Take 1 tablet (100 mg total) by mouth 2 (two) times daily.   escitalopram (LEXAPRO) 10 MG tablet Take 1 tablet (10 mg total) by mouth daily.   fluticasone (FLONASE) 50 MCG/ACT nasal spray Place 2 sprays into both nostrils daily.   omega-3 acid ethyl esters (LOVAZA) 1 g capsule Take 2 capsules (2 g total) by mouth daily.   omeprazole (PRILOSEC) 40 MG capsule Take 1  capsule (40 mg total) by mouth in the morning and at bedtime.   vitamin B-12 (CYANOCOBALAMIN) 1000 MCG tablet Take 1,000 mcg by mouth daily.   Vitamin D3 (VITAMIN D) 25 MCG tablet Take 1,000 Units by mouth daily.   azithromycin (ZITHROMAX) 250 MG tablet Take 2 tablets on day 1, then 1 tablet daily on days 2 through 5   No facility-administered encounter medications on file as of 11/16/2022.    Surgical History: Past Surgical History:  Procedure Laterality Date   no surgical history      Medical History: Past Medical History:  Diagnosis Date   Anxiety    Anxiety    Family history of colon cancer    Family history of leukemia    Family history of Lynch syndrome    Family history of prostate cancer     Family History: Family History  Problem Relation Age of Onset   Healthy Mother    Colon cancer Father        MSH2+ Lynch syndrome, CHEK2+   Colon cancer Paternal Uncle 106   Colon cancer Paternal Grandfather        "Lynch+"   Prostate cancer Paternal Grandfather        dx 75s   Leukemia Half-Brother 78   Colon cancer Half-Brother 34       "Lynch+"    Social History   Socioeconomic History   Marital status: Married    Spouse name:  Not on file   Number of children: Not on file   Years of education: Not on file   Highest education level: Not on file  Occupational History   Not on file  Tobacco Use   Smoking status: Every Day    Years: 10.00    Types: Cigarettes    Last attempt to quit: 11/28/2016    Years since quitting: 5.9   Smokeless tobacco: Never   Tobacco comments:    1/2 pack-1 pack daily  Vaping Use   Vaping Use: Never used  Substance and Sexual Activity   Alcohol use: No   Drug use: No   Sexual activity: Not on file  Other Topics Concern   Not on file  Social History Narrative   Not on file   Social Determinants of Health   Financial Resource Strain: Not on file  Food Insecurity: Not on file  Transportation Needs: Not on file  Physical  Activity: Not on file  Stress: Not on file  Social Connections: Not on file  Intimate Partner Violence: Not on file      Review of Systems  HENT:  Positive for congestion, postnasal drip, rhinorrhea, sinus pressure, sinus pain and sore throat.   Respiratory:  Positive for cough and shortness of breath. Negative for chest tightness and wheezing.   Cardiovascular:  Negative for chest pain and palpitations.  Neurological:  Negative for headaches.    Vital Signs: Ht 5\' 9"  (1.753 m)   Wt 230 lb (104.3 kg)   BMI 33.97 kg/m    Observation/Objective:  Pt is able to carry out conversation   Assessment/Plan: 1. Acute non-recurrent frontal sinusitis Will start zpak and continue nasal spray. May try OTC decongestant however aware it can affect HR and BP. Advised to rest and stay hydrated. - azithromycin (ZITHROMAX) 250 MG tablet; Take 2 tablets on day 1, then 1 tablet daily on days 2 through 5  Dispense: 6 tablet; Refill: 0   General Counseling: Scott verbalizes understanding of the findings of today's phone visit and agrees with plan of treatment. I have discussed any further diagnostic evaluation that may be needed or ordered today. We also reviewed his medications today. he has been encouraged to call the office with any questions or concerns that should arise related to todays visit.    No orders of the defined types were placed in this encounter.   Meds ordered this encounter  Medications   azithromycin (ZITHROMAX) 250 MG tablet    Sig: Take 2 tablets on day 1, then 1 tablet daily on days 2 through 5    Dispense:  6 tablet    Refill:  0    Time spent:25 Minutes    Dr Lavera Guise Internal medicine

## 2022-11-30 ENCOUNTER — Other Ambulatory Visit: Payer: Self-pay | Admitting: Nurse Practitioner

## 2022-11-30 DIAGNOSIS — F411 Generalized anxiety disorder: Secondary | ICD-10-CM

## 2022-12-03 ENCOUNTER — Encounter: Payer: Self-pay | Admitting: Nurse Practitioner

## 2022-12-03 ENCOUNTER — Ambulatory Visit: Payer: Managed Care, Other (non HMO) | Admitting: Nurse Practitioner

## 2022-12-03 VITALS — BP 139/82 | HR 91 | Temp 98.3°F | Resp 16 | Ht 69.0 in | Wt 246.6 lb

## 2022-12-03 DIAGNOSIS — E538 Deficiency of other specified B group vitamins: Secondary | ICD-10-CM | POA: Diagnosis not present

## 2022-12-03 DIAGNOSIS — F411 Generalized anxiety disorder: Secondary | ICD-10-CM

## 2022-12-03 DIAGNOSIS — K219 Gastro-esophageal reflux disease without esophagitis: Secondary | ICD-10-CM | POA: Diagnosis not present

## 2022-12-03 DIAGNOSIS — E559 Vitamin D deficiency, unspecified: Secondary | ICD-10-CM

## 2022-12-03 DIAGNOSIS — E782 Mixed hyperlipidemia: Secondary | ICD-10-CM

## 2022-12-03 DIAGNOSIS — R5383 Other fatigue: Secondary | ICD-10-CM

## 2022-12-03 MED ORDER — ALPRAZOLAM 0.5 MG PO TABS
0.5000 mg | ORAL_TABLET | Freq: Every evening | ORAL | 0 refills | Status: DC | PRN
Start: 2022-12-03 — End: 2023-05-07

## 2022-12-03 MED ORDER — OMEPRAZOLE 40 MG PO CPDR: 40.0000 mg | DELAYED_RELEASE_CAPSULE | Freq: Two times a day (BID) | ORAL | 1 refills | Status: DC

## 2022-12-03 NOTE — Progress Notes (Signed)
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    Current Medication: Outpatient Encounter Medications as of 12/03/2022  Medication Sig   escitalopram (LEXAPRO) 10 MG tablet TAKE 1 TABLET(10 MG) BY MOUTH DAILY   fluticasone (FLONASE) 50 MCG/ACT nasal spray Place 2 sprays into both nostrils daily.   omega-3 acid ethyl esters (LOVAZA) 1 g capsule Take 2 capsules (2 g total) by mouth daily.   vitamin B-12 (CYANOCOBALAMIN) 1000 MCG tablet Take 1,000 mcg by mouth daily.   Vitamin D3 (VITAMIN D) 25 MCG tablet Take 1,000 Units by mouth daily.   [DISCONTINUED] ALPRAZolam (XANAX) 0.5 MG tablet Take 1 tablet (0.5 mg total) by mouth at bedtime as needed for anxiety or sleep.   [DISCONTINUED] doxycycline (VIBRA-TABS) 100 MG tablet Take 1 tablet (100 mg total) by mouth 2 (two) times daily.   [DISCONTINUED] omeprazole (PRILOSEC) 40 MG capsule Take 1 capsule (40 mg total) by mouth in the morning and at bedtime.   ALPRAZolam (XANAX) 0.5 MG tablet Take 1 tablet (0.5 mg total) by mouth at bedtime as needed for anxiety or sleep.   omeprazole (PRILOSEC) 40 MG capsule Take 1 capsule (40 mg total) by mouth in the morning and at bedtime.   No facility-administered encounter medications on file as of 12/03/2022.    Surgical History: Past Surgical History:  Procedure Laterality Date   no surgical history      Medical History: Past Medical History:   Diagnosis Date   Anxiety    Anxiety    Family history of colon cancer    Family history of leukemia    Family history of Lynch syndrome    Family history of prostate cancer     Family History: Family History  Problem Relation Age of Onset   Healthy Mother    Colon cancer Father        MSH2+ Lynch syndrome, CHEK2+   Colon cancer Paternal Uncle 27   Colon cancer Paternal Grandfather        "Lynch+"   Prostate cancer Paternal Grandfather        dx 58s   Leukemia Half-Brother 40   Colon cancer Half-Brother 71       "Lynch+"    Social History   Socioeconomic History   Marital status: Married    Spouse name: Not on file   Number of children: Not on file   Years of education: Not on file   Highest education level: Not on file  Occupational History   Not on file  Tobacco Use   Smoking status: Every Day    Years: 10.00    Types: Cigarettes    Last attempt to quit: 11/28/2016    Years since quitting: 6.0   Smokeless tobacco: Never   Tobacco comments:    1/2 pack-1 pack daily  Vaping Use   Vaping Use: Never used  Substance and Sexual Activity   Alcohol use: No   Drug use:  No   Sexual activity: Not on file  Other Topics Concern   Not on file  Social History Narrative   Not on file   Social Determinants of Health   Financial Resource Strain: Not on file  Food Insecurity: Not on file  Transportation Needs: Not on file  Physical Activity: Not on file  Stress: Not on file  Social Connections: Not on file  Intimate Partner Violence: Not on file      Review of Systems  Constitutional:  Positive for fatigue. Negative for fever.  HENT:  Negative for congestion, mouth sores and postnasal drip.   Respiratory:  Negative for cough and shortness of breath.   Cardiovascular:  Negative for chest pain, palpitations and leg swelling.  Genitourinary:  Negative for flank pain.  Psychiatric/Behavioral:  Positive for sleep disturbance. The patient is nervous/anxious.      Vital Signs: BP 139/82   Pulse 91   Temp 98.3 F (36.8 C)   Resp 16   Ht 5' 9"$  (1.753 m)   Wt 246 lb 9.6 oz (111.9 kg)   SpO2 98%   BMI 36.42 kg/m    Physical Exam Vitals reviewed.  Constitutional:      General: He is not in acute distress.    Appearance: Normal appearance. He is obese. He is not ill-appearing.  HENT:     Head: Normocephalic and atraumatic.  Eyes:     Pupils: Pupils are equal, round, and reactive to light.  Cardiovascular:     Rate and Rhythm: Normal rate and regular rhythm.  Pulmonary:     Effort: Pulmonary effort is normal. No respiratory distress.  Neurological:     Mental Status: He is alert and oriented to person, place, and time.  Psychiatric:        Mood and Affect: Mood normal.        Behavior: Behavior normal.        Assessment/Plan: 1. Other fatigue Routine and additional labs ordered. - B12 and Folate Panel - Vitamin D (25 hydroxy) - CBC with Differential/Platelet - Iron, TIBC and Ferritin Panel - TSH + free T4  2. Gastroesophageal reflux disease without esophagitis Stable, continue omeprazole as prescribed.  - omeprazole (PRILOSEC) 40 MG capsule; Take 1 capsule (40 mg total) by mouth in the morning and at bedtime.  Dispense: 180 capsule; Refill: 1  3. Mixed hyperlipidemia Routine labs ordered.  - Lipid Profile - B12 and Folate Panel - TSH + free T4  4. B12 deficiency Routine labs ordered. - B12 and Folate Panel - CBC with Differential/Platelet - Iron, TIBC and Ferritin Panel  5. Vitamin D deficiency Routine lab ordered.  - Vitamin D (25 hydroxy)  6. GAD (generalized anxiety disorder) Stable, continue alprazolam as needed as prescribed.  - ALPRAZolam (XANAX) 0.5 MG tablet; Take 1 tablet (0.5 mg total) by mouth at bedtime as needed for anxiety or sleep.  Dispense: 60 tablet; Refill: 0   General Counseling: Alfred Gross verbalizes understanding of the findings of todays visit and agrees with plan of treatment. I  have discussed any further diagnostic evaluation that may be needed or ordered today. We also reviewed his medications today. he has been encouraged to call the office with any questions or concerns that should arise related to todays visit.    Orders Placed This Encounter  Procedures   Lipid Profile   B12 and Folate Panel   Vitamin D (25 hydroxy)   CBC with Differential/Platelet   Iron, TIBC and Ferritin Panel  TSH + free T4    Meds ordered this encounter  Medications   ALPRAZolam (XANAX) 0.5 MG tablet    Sig: Take 1 tablet (0.5 mg total) by mouth at bedtime as needed for anxiety or sleep.    Dispense:  60 tablet    Refill:  0   omeprazole (PRILOSEC) 40 MG capsule    Sig: Take 1 capsule (40 mg total) by mouth in the morning and at bedtime.    Dispense:  180 capsule    Refill:  1    Return for previously scheduled, CPE, Glendon Dunwoody PCP.   Total time spent:30 Minutes Time spent includes review of chart, medications, test results, and follow up plan with the patient.   [place] Controlled Substance Database was reviewed by me.  This patient was seen by Jonetta Osgood, FNP-C in collaboration with Dr. Clayborn Bigness as a part of collaborative care agreement.   Syanna Remmert R. Valetta Fuller, MSN, FNP-C Internal medicine

## 2022-12-07 ENCOUNTER — Encounter: Payer: Self-pay | Admitting: Nurse Practitioner

## 2023-04-04 ENCOUNTER — Encounter: Payer: Managed Care, Other (non HMO) | Admitting: Nurse Practitioner

## 2023-04-17 ENCOUNTER — Telehealth: Payer: Self-pay

## 2023-04-17 NOTE — Telephone Encounter (Signed)
Patient spouse Aundra Millet called stating they are out of town and are about 2 an half hours away from a hospital but he is having kidney stone pains that are making him sick. Spoke with Alyssa and she stated he needs to go to the closer Urgent care or hospital. Patient spouse was notified.

## 2023-05-07 ENCOUNTER — Ambulatory Visit (INDEPENDENT_AMBULATORY_CARE_PROVIDER_SITE_OTHER): Payer: Managed Care, Other (non HMO) | Admitting: Nurse Practitioner

## 2023-05-07 ENCOUNTER — Encounter: Payer: Self-pay | Admitting: Nurse Practitioner

## 2023-05-07 VITALS — BP 110/74 | HR 100 | Temp 98.3°F | Resp 16 | Ht 69.0 in | Wt 241.8 lb

## 2023-05-07 DIAGNOSIS — Z0001 Encounter for general adult medical examination with abnormal findings: Secondary | ICD-10-CM | POA: Diagnosis not present

## 2023-05-07 DIAGNOSIS — R300 Dysuria: Secondary | ICD-10-CM

## 2023-05-07 DIAGNOSIS — F411 Generalized anxiety disorder: Secondary | ICD-10-CM | POA: Diagnosis not present

## 2023-05-07 DIAGNOSIS — K219 Gastro-esophageal reflux disease without esophagitis: Secondary | ICD-10-CM | POA: Diagnosis not present

## 2023-05-07 MED ORDER — ALPRAZOLAM 0.5 MG PO TABS: 0.5000 mg | ORAL_TABLET | Freq: Every evening | ORAL | 0 refills | Status: DC | PRN

## 2023-05-07 MED ORDER — ESCITALOPRAM OXALATE 10 MG PO TABS: 10.0000 mg | ORAL_TABLET | Freq: Every day | ORAL | 1 refills | Status: DC

## 2023-05-07 MED ORDER — OMEPRAZOLE 20 MG PO CPDR: 20.0000 mg | DELAYED_RELEASE_CAPSULE | Freq: Every day | ORAL | Status: AC

## 2023-05-07 NOTE — Progress Notes (Signed)
[facility] [address] [place], [place] [address]  Internal MEDICINE  Office Visit Note  Patient Name: Alfred Gross  [id number]  [id number]  Date of Service: 05/07/2023  Chief Complaint  Patient presents with   Annual Exam    HPI Alfred Gross presents for an annual well visit and physical exam.  Well-appearing 32 y.o. male with allergic rhinitis, GERD, high cholesterol and anxiety.  Eye exam and/or foot exam: Labs: patient states that he has his labs drawn that were ordered in February but there is no record of them being drawn or resulting. He will go back and have the labs drawn again.  New or worsening pain: none  Other concerns: Has had some kidney stones for the first time in 5 years.    Current Medication: Outpatient Encounter Medications as of 05/07/2023  Medication Sig   fluticasone (FLONASE) 50 MCG/ACT nasal spray Place 2 sprays into both nostrils daily.   omega-3 acid ethyl esters (LOVAZA) 1 g capsule Take 2 capsules (2 g total) by mouth daily.   omeprazole (PRILOSEC) 20 MG capsule Take 1 capsule (20 mg total) by mouth daily.   vitamin B-12 (CYANOCOBALAMIN) 1000 MCG tablet Take 1,000 mcg by mouth daily.   Vitamin D3 (VITAMIN D) 25 MCG tablet Take 1,000 Units by mouth daily.   [DISCONTINUED] ALPRAZolam (XANAX) 0.5 MG tablet Take 1 tablet (0.5 mg total) by mouth at bedtime as needed for anxiety or sleep.   [DISCONTINUED] escitalopram (LEXAPRO) 10 MG tablet TAKE 1 TABLET(10 MG) BY MOUTH DAILY   [DISCONTINUED] omeprazole (PRILOSEC) 40 MG capsule Take 1 capsule (40 mg total) by mouth in the morning and at bedtime.   ALPRAZolam (XANAX) 0.5 MG tablet Take 1 tablet (0.5 mg total) by mouth at bedtime as needed for anxiety or sleep.   escitalopram (LEXAPRO) 10 MG tablet Take 1 tablet (10 mg total) by mouth daily.   No facility-administered encounter medications on file as of 05/07/2023.    Surgical History: Past Surgical History:  Procedure Laterality Date    no surgical history      Medical History: Past Medical History:  Diagnosis Date   Anxiety    Anxiety    Family history of colon cancer    Family history of leukemia    Family history of Lynch syndrome    Family history of prostate cancer     Family History: Family History  Problem Relation Age of Onset   Healthy Mother    Colon cancer Father        MSH2+ Lynch syndrome, CHEK2+   Colon cancer Paternal Uncle 76   Colon cancer Paternal Grandfather        "Lynch+"   Prostate cancer Paternal Grandfather        dx 77s   Leukemia Half-Brother 52   Colon cancer Half-Brother 36       "Lynch+"    Social History   Socioeconomic History   Marital status: Married    Spouse name: Not on file   Number of children: Not on file   Years of education: Not on file   Highest education level: Not on file  Occupational History   Not on file  Tobacco Use   Smoking status: Every Day    Years: 10    Types: Cigarettes    Last attempt to quit: 11/28/2016    Years since quitting: 6.4   Smokeless tobacco: Never   Tobacco comments:    1/2 pack-1 pack daily  Vaping Use  Vaping Use: Never used  Substance and Sexual Activity   Alcohol use: No   Drug use: No   Sexual activity: Not on file  Other Topics Concern   Not on file  Social History Narrative   Not on file   Social Determinants of Health   Financial Resource Strain: Not on file  Food Insecurity: Not on file  Transportation Needs: Not on file  Physical Activity: Not on file  Stress: Not on file  Social Connections: Not on file  Intimate Partner Violence: Not on file      Review of Systems  Constitutional:  Positive for fatigue. Negative for activity change, appetite change, chills, fever and unexpected weight change.  HENT: Negative.  Negative for congestion, ear pain, rhinorrhea, sore throat and trouble swallowing.   Eyes: Negative.   Respiratory: Negative.  Negative for cough, chest tightness, shortness of breath  and wheezing.   Cardiovascular:  Positive for palpitations. Negative for chest pain.  Gastrointestinal: Negative.  Negative for abdominal pain, blood in stool, constipation, diarrhea, nausea and vomiting.  Endocrine: Negative.   Genitourinary: Negative.  Negative for difficulty urinating, dysuria, frequency, hematuria and urgency.  Musculoskeletal: Negative.  Negative for arthralgias, back pain, joint swelling, myalgias and neck pain.  Skin: Negative.  Negative for rash and wound.  Allergic/Immunologic: Negative.  Negative for immunocompromised state.  Neurological: Negative.  Negative for dizziness, seizures, numbness and headaches.  Hematological: Negative.   Psychiatric/Behavioral:  Positive for sleep disturbance. Negative for behavioral problems, self-injury and suicidal ideas. The patient is nervous/anxious.     Vital Signs: BP 110/74 Comment: 130/90  Pulse 100   Temp 98.3 F (36.8 C)   Resp 16   Ht 5\' 9"  (1.753 m)   Wt 241 lb 12.8 oz (109.7 kg)   SpO2 95%   BMI 35.71 kg/m    Physical Exam Vitals reviewed.  Constitutional:      General: He is awake. He is not in acute distress.    Appearance: Normal appearance. He is well-developed and well-groomed. He is obese. He is not ill-appearing or diaphoretic.  HENT:     Head: Normocephalic and atraumatic.     Right Ear: Tympanic membrane, ear canal and external ear normal.     Left Ear: Tympanic membrane, ear canal and external ear normal.     Nose: Nose normal. No congestion or rhinorrhea.     Mouth/Throat:     Lips: Pink.     Mouth: Mucous membranes are moist.     Pharynx: Oropharynx is clear. Uvula midline. No oropharyngeal exudate or posterior oropharyngeal erythema.  Eyes:     General: Lids are normal. Vision grossly intact. Gaze aligned appropriately. No scleral icterus.       Right eye: No discharge.        Left eye: No discharge.     Extraocular Movements: Extraocular movements intact.     Conjunctiva/sclera:  Conjunctivae normal.     Pupils: Pupils are equal, round, and reactive to light.     Funduscopic exam:    Right eye: Red reflex present.        Left eye: Red reflex present. Neck:     Thyroid: No thyromegaly.     Vascular: No carotid bruit or JVD.     Trachea: Trachea and phonation normal. No tracheal deviation.  Cardiovascular:     Rate and Rhythm: Normal rate and regular rhythm.     Pulses: Normal pulses.     Heart sounds: Normal heart sounds,  S1 normal and S2 normal. No murmur heard.    No friction rub. No gallop.  Pulmonary:     Effort: Pulmonary effort is normal. No accessory muscle usage or respiratory distress.     Breath sounds: Normal breath sounds and air entry. No stridor. No wheezing or rales.  Chest:     Chest wall: No tenderness.  Abdominal:     General: Bowel sounds are normal. There is no distension.     Palpations: Abdomen is soft. There is no shifting dullness, fluid wave, mass or pulsatile mass.     Tenderness: There is no abdominal tenderness. There is no guarding or rebound.  Musculoskeletal:        General: No tenderness or deformity. Normal range of motion.     Cervical back: Normal range of motion and neck supple.     Right lower leg: No edema.     Left lower leg: No edema.  Lymphadenopathy:     Cervical: No cervical adenopathy.  Skin:    General: Skin is warm and dry.     Capillary Refill: Capillary refill takes less than 2 seconds.     Coloration: Skin is not pale.     Findings: No erythema or rash.  Neurological:     Mental Status: He is alert and oriented to person, place, and time.     Cranial Nerves: No cranial nerve deficit.     Motor: No abnormal muscle tone.     Coordination: Coordination normal.     Deep Tendon Reflexes: Reflexes are normal and symmetric.  Psychiatric:        Mood and Affect: Affect normal. Mood is anxious.        Speech: Speech normal.        Behavior: Behavior normal. Behavior is cooperative.        Thought Content:  Thought content normal. Thought content is not paranoid.        Judgment: Judgment normal.        Assessment/Plan: 1. Encounter for routine adult health examination with abnormal findings Age-appropriate preventive screenings and vaccinations discussed, annual physical exam completed. Routine labs for health maintenance ordered, reminded patient to have his labs drawn and patient given a new lab slip. PHM updated.   2. Gastroesophageal reflux disease without esophagitis Continue omeprazole as prescribed.  - omeprazole (PRILOSEC) 20 MG capsule; Take 1 capsule (20 mg total) by mouth daily.  3. Dysuria Routine urinalysis done  - UA/M w/rflx Culture, Routine - Microscopic Examination  4. GAD (generalized anxiety disorder) Medications refilled, continue alprazolam and escitalopram as prescribed.  - escitalopram (LEXAPRO) 10 MG tablet; Take 1 tablet (10 mg total) by mouth daily.  Dispense: 90 tablet; Refill: 1 - ALPRAZolam (XANAX) 0.5 MG tablet; Take 1 tablet (0.5 mg total) by mouth at bedtime as needed for anxiety or sleep.  Dispense: 60 tablet; Refill: 0      General Counseling: Alfred Gross verbalizes understanding of the findings of todays visit and agrees with plan of treatment. I have discussed any further diagnostic evaluation that may be needed or ordered today. We also reviewed his medications today. he has been encouraged to call the office with any questions or concerns that should arise related to todays visit.    Orders Placed This Encounter  Procedures   UA/M w/rflx Culture, Routine    Meds ordered this encounter  Medications   escitalopram (LEXAPRO) 10 MG tablet    Sig: Take 1 tablet (10 mg total) by mouth daily.  Dispense:  90 tablet    Refill:  1    Refills   ALPRAZolam (XANAX) 0.5 MG tablet    Sig: Take 1 tablet (0.5 mg total) by mouth at bedtime as needed for anxiety or sleep.    Dispense:  60 tablet    Refill:  0   omeprazole (PRILOSEC) 20 MG capsule     Sig: Take 1 capsule (20 mg total) by mouth daily.    Updating dose    Return in about 6 months (around 11/07/2023) for F/U, Alfred Gross PCP.   Total time spent:30 Minutes Time spent includes review of chart, medications, test results, and follow up plan with the patient.   [place] Controlled Substance Database was reviewed by me.  This patient was seen by Sallyanne Kuster, FNP-C in collaboration with Dr. Beverely Risen as a part of collaborative care agreement.  Alfred Gross R. Tedd Sias, MSN, FNP-C Internal medicine

## 2023-05-08 ENCOUNTER — Encounter: Payer: Self-pay | Admitting: Nurse Practitioner

## 2023-05-08 LAB — UA/M W/RFLX CULTURE, ROUTINE
Bilirubin, UA: NEGATIVE
Glucose, UA: NEGATIVE
Ketones, UA: NEGATIVE
Leukocytes,UA: NEGATIVE
Nitrite, UA: NEGATIVE
Protein,UA: NEGATIVE
Specific Gravity, UA: 1.021 (ref 1.005–1.030)
Urobilinogen, Ur: 0.2 mg/dL (ref 0.2–1.0)
pH, UA: 5.5 (ref 5.0–7.5)

## 2023-05-08 LAB — MICROSCOPIC EXAMINATION
Bacteria, UA: NONE SEEN
Casts: NONE SEEN /lpf
Epithelial Cells (non renal): NONE SEEN /hpf (ref 0–10)
WBC, UA: NONE SEEN /hpf (ref 0–5)

## 2023-06-19 ENCOUNTER — Other Ambulatory Visit: Payer: Self-pay | Admitting: Nurse Practitioner

## 2023-06-19 DIAGNOSIS — K219 Gastro-esophageal reflux disease without esophagitis: Secondary | ICD-10-CM

## 2023-07-26 ENCOUNTER — Encounter: Payer: Self-pay | Admitting: Nurse Practitioner

## 2023-07-26 ENCOUNTER — Telehealth (INDEPENDENT_AMBULATORY_CARE_PROVIDER_SITE_OTHER): Payer: Commercial Managed Care - PPO | Admitting: Nurse Practitioner

## 2023-07-26 VITALS — Ht 69.0 in | Wt 241.0 lb

## 2023-07-26 DIAGNOSIS — J301 Allergic rhinitis due to pollen: Secondary | ICD-10-CM | POA: Diagnosis not present

## 2023-07-26 DIAGNOSIS — F411 Generalized anxiety disorder: Secondary | ICD-10-CM | POA: Diagnosis not present

## 2023-07-26 DIAGNOSIS — J208 Acute bronchitis due to other specified organisms: Secondary | ICD-10-CM | POA: Diagnosis not present

## 2023-07-26 DIAGNOSIS — B9689 Other specified bacterial agents as the cause of diseases classified elsewhere: Secondary | ICD-10-CM | POA: Diagnosis not present

## 2023-07-26 MED ORDER — PREDNISONE 10 MG PO TABS
ORAL_TABLET | ORAL | 0 refills | Status: DC
Start: 2023-07-26 — End: 2024-01-20

## 2023-07-26 MED ORDER — ALPRAZOLAM 0.5 MG PO TABS: 0.5000 mg | ORAL_TABLET | Freq: Every evening | ORAL | 0 refills | Status: DC | PRN

## 2023-07-26 MED ORDER — DOXYCYCLINE HYCLATE 100 MG PO TABS
100.0000 mg | ORAL_TABLET | Freq: Two times a day (BID) | ORAL | 0 refills | Status: AC
Start: 2023-07-26 — End: 2023-08-05

## 2023-07-26 MED ORDER — FLUTICASONE PROPIONATE 50 MCG/ACT NA SUSP: 2.0000 | Freq: Every day | NASAL | 6 refills | Status: AC

## 2023-07-26 MED ORDER — ALBUTEROL SULFATE HFA 108 (90 BASE) MCG/ACT IN AERS
1.0000 | INHALATION_SPRAY | Freq: Four times a day (QID) | RESPIRATORY_TRACT | 2 refills | Status: DC | PRN
Start: 2023-07-26 — End: 2024-01-20

## 2023-07-26 NOTE — Progress Notes (Signed)
[facility] [address] [place], [place] [address]  Internal MEDICINE  Telephone Visit  Patient Name: Alfred Gross  [id number]  [id number]  Date of Service: 07/26/2023  I connected with the patient at 0945 by telephone and verified the patients identity using two identifiers.   I discussed the limitations, risks, security and privacy concerns of performing an evaluation and management service by telephone and the availability of in person appointments. I also discussed with the patient that there may be a patient responsible charge related to the service.  The patient expressed understanding and agrees to proceed.    Chief Complaint  Patient presents with   Telephone Assessment    video   Telephone Screen    Covid test   Sinusitis    Symptoms going last 3 days    Cough    wheezing    HPI Criss presents for a telehealth virtual visit for possible symptoms of sinusitis.  Reports cough and wheezing x3 days as well as SOB and chest tightness, body aches and fatigue. Reports that other family members were sick in the house before him.  Covid test negative.     Current Medication: Outpatient Encounter Medications as of 07/26/2023  Medication Sig   albuterol (VENTOLIN HFA) 108 (90 Base) MCG/ACT inhaler Inhale 1-2 puffs into the lungs every 6 (six) hours as needed for wheezing or shortness of breath.   doxycycline (VIBRA-TABS) 100 MG tablet Take 1 tablet (100 mg total) by mouth 2 (two) times daily for 10 days. Take with food   escitalopram (LEXAPRO) 10 MG tablet Take 1 tablet (10 mg total) by mouth daily.   omega-3 acid ethyl esters (LOVAZA) 1 g capsule Take 2 capsules (2 g total) by mouth daily.   omeprazole (PRILOSEC) 20 MG capsule Take 1 capsule (20 mg total) by mouth daily.   predniSONE (DELTASONE) 10 MG tablet Take one tab 3 x day for 3 days, then take one tab 2 x a day for 3 days and then take one tab a day for 3 days for wheezing   vitamin B-12  (CYANOCOBALAMIN) 1000 MCG tablet Take 1,000 mcg by mouth daily.   Vitamin D3 (VITAMIN D) 25 MCG tablet Take 1,000 Units by mouth daily.   [DISCONTINUED] ALPRAZolam (XANAX) 0.5 MG tablet Take 1 tablet (0.5 mg total) by mouth at bedtime as needed for anxiety or sleep.   [DISCONTINUED] fluticasone (FLONASE) 50 MCG/ACT nasal spray Place 2 sprays into both nostrils daily.   ALPRAZolam (XANAX) 0.5 MG tablet Take 1 tablet (0.5 mg total) by mouth at bedtime as needed for anxiety or sleep.   fluticasone (FLONASE) 50 MCG/ACT nasal spray Place 2 sprays into both nostrils daily.   No facility-administered encounter medications on file as of 07/26/2023.    Surgical History: Past Surgical History:  Procedure Laterality Date   no surgical history      Medical History: Past Medical History:  Diagnosis Date   Anxiety    Anxiety    Family history of colon cancer    Family history of leukemia    Family history of Lynch syndrome    Family history of prostate cancer     Family History: Family History  Problem Relation Age of Onset   Healthy Mother    Colon cancer Father        MSH2+ Lynch syndrome, CHEK2+   Colon cancer Paternal Uncle 61   Colon cancer Paternal Grandfather        "Lynch+"  Prostate cancer Paternal Grandfather        dx 89s   Leukemia Half-Brother 89   Colon cancer Half-Brother 57       "Lynch+"    Social History   Socioeconomic History   Marital status: Married    Spouse name: Not on file   Number of children: Not on file   Years of education: Not on file   Highest education level: Not on file  Occupational History   Not on file  Tobacco Use   Smoking status: Every Day    Current packs/day: 0.00    Types: Cigarettes    Start date: 11/28/2006    Last attempt to quit: 11/28/2016    Years since quitting: 6.6   Smokeless tobacco: Never   Tobacco comments:    1/2 pack-1 pack daily  Vaping Use   Vaping status: Never Used  Substance and Sexual Activity   Alcohol  use: No   Drug use: No   Sexual activity: Not on file  Other Topics Concern   Not on file  Social History Narrative   Not on file   Social Determinants of Health   Financial Resource Strain: Not on file  Food Insecurity: Not on file  Transportation Needs: Not on file  Physical Activity: Not on file  Stress: Not on file  Social Connections: Not on file  Intimate Partner Violence: Not on file      Review of Systems  Constitutional:  Positive for fatigue. Negative for chills and fever.  HENT:  Negative for sinus pressure and sinus pain.   Respiratory:  Positive for cough, chest tightness, shortness of breath and wheezing.   Cardiovascular: Negative.  Negative for chest pain and palpitations.  Musculoskeletal:  Positive for myalgias (body aches).    Vital Signs: Ht 5\' 9"  (1.753 m)   Wt 241 lb (109.3 kg)   BMI 35.59 kg/m    Observation/Objective: He is alert and oriented. Appears slightly short of breath when speaking. Observed coughing frequently on video. No acute distress noted. At work but will be leaving and going home soon to rest.     Assessment/Plan: 1. Acute bacterial bronchitis Doxycycline x10 days and prednisone taper prescribed for probably bronchitis with wheezing. Albuterol inhaler ordered for rescue for SOB and/or wheezing as well.  - predniSONE (DELTASONE) 10 MG tablet; Take one tab 3 x day for 3 days, then take one tab 2 x a day for 3 days and then take one tab a day for 3 days for wheezing  Dispense: 18 tablet; Refill: 0 - doxycycline (VIBRA-TABS) 100 MG tablet; Take 1 tablet (100 mg total) by mouth 2 (two) times daily for 10 days. Take with food  Dispense: 20 tablet; Refill: 0 - albuterol (VENTOLIN HFA) 108 (90 Base) MCG/ACT inhaler; Inhale 1-2 puffs into the lungs every 6 (six) hours as needed for wheezing or shortness of breath.  Dispense: 8 g; Refill: 2  2. Seasonal allergic rhinitis due to pollen Continue fluticasone as prescribed. Refill ordered  -  fluticasone (FLONASE) 50 MCG/ACT nasal spray; Place 2 sprays into both nostrils daily.  Dispense: 16 g; Refill: 6  3. GAD (generalized anxiety disorder) Continue alprazolam prn as prescribed. Refill ordered  - ALPRAZolam (XANAX) 0.5 MG tablet; Take 1 tablet (0.5 mg total) by mouth at bedtime as needed for anxiety or sleep.  Dispense: 60 tablet; Refill: 0   General Counseling: Abdiel verbalizes understanding of the findings of today's phone visit and agrees with plan of  treatment. I have discussed any further diagnostic evaluation that may be needed or ordered today. We also reviewed his medications today. he has been encouraged to call the office with any questions or concerns that should arise related to todays visit.  Return if symptoms worsen or fail to improve.   No orders of the defined types were placed in this encounter.   Meds ordered this encounter  Medications   predniSONE (DELTASONE) 10 MG tablet    Sig: Take one tab 3 x day for 3 days, then take one tab 2 x a day for 3 days and then take one tab a day for 3 days for wheezing    Dispense:  18 tablet    Refill:  0   doxycycline (VIBRA-TABS) 100 MG tablet    Sig: Take 1 tablet (100 mg total) by mouth 2 (two) times daily for 10 days. Take with food    Dispense:  20 tablet    Refill:  0   ALPRAZolam (XANAX) 0.5 MG tablet    Sig: Take 1 tablet (0.5 mg total) by mouth at bedtime as needed for anxiety or sleep.    Dispense:  60 tablet    Refill:  0   albuterol (VENTOLIN HFA) 108 (90 Base) MCG/ACT inhaler    Sig: Inhale 1-2 puffs into the lungs every 6 (six) hours as needed for wheezing or shortness of breath.    Dispense:  8 g    Refill:  2   fluticasone (FLONASE) 50 MCG/ACT nasal spray    Sig: Place 2 sprays into both nostrils daily.    Dispense:  16 g    Refill:  6    Time spent:10 Minutes Time spent with patient included reviewing progress notes, labs, imaging studies, and discussing plan for follow up.  [place]  Controlled Substance Database was reviewed by me for overdose risk score (ORS) if appropriate.  This patient was seen by Sallyanne Kuster, FNP-C in collaboration with Dr. Beverely Risen as a part of collaborative care agreement.  Letita Prentiss R. Tedd Sias, MSN, FNP-C Internal medicine

## 2023-11-12 ENCOUNTER — Ambulatory Visit: Payer: Commercial Managed Care - PPO | Admitting: Nurse Practitioner

## 2023-12-01 ENCOUNTER — Other Ambulatory Visit: Payer: Self-pay | Admitting: Nurse Practitioner

## 2023-12-01 DIAGNOSIS — F411 Generalized anxiety disorder: Secondary | ICD-10-CM

## 2023-12-02 NOTE — Telephone Encounter (Signed)
Pt no show last appt

## 2024-01-20 ENCOUNTER — Ambulatory Visit: Payer: Self-pay | Admitting: Nurse Practitioner

## 2024-01-20 ENCOUNTER — Encounter: Payer: Self-pay | Admitting: Nurse Practitioner

## 2024-01-20 VITALS — BP 120/88 | HR 84 | Temp 98.6°F | Resp 16 | Ht 69.0 in | Wt 249.8 lb

## 2024-01-20 DIAGNOSIS — J301 Allergic rhinitis due to pollen: Secondary | ICD-10-CM | POA: Diagnosis not present

## 2024-01-20 DIAGNOSIS — F411 Generalized anxiety disorder: Secondary | ICD-10-CM | POA: Diagnosis not present

## 2024-01-20 DIAGNOSIS — K219 Gastro-esophageal reflux disease without esophagitis: Secondary | ICD-10-CM | POA: Diagnosis not present

## 2024-01-20 MED ORDER — ESCITALOPRAM OXALATE 10 MG PO TABS
10.0000 mg | ORAL_TABLET | Freq: Every day | ORAL | 1 refills | Status: DC
Start: 2024-01-20 — End: 2024-05-07

## 2024-01-20 MED ORDER — ALPRAZOLAM 0.5 MG PO TABS
0.5000 mg | ORAL_TABLET | Freq: Every evening | ORAL | 0 refills | Status: DC | PRN
Start: 2024-01-20 — End: 2024-05-07

## 2024-01-20 NOTE — Progress Notes (Signed)
 [facility] [address] [place], [place] [address]  Internal MEDICINE  Office Visit Note  Patient Name: Alfred Gross  [id number]  [id number]  Date of Service: 01/20/2024  Chief Complaint  Patient presents with   Follow-up    Needs driver's medication form completed.     HPI Alfred Gross presents for a follow-up visit for GERD, allergic rhinitis and anxiety. GERD-- takes omeprazole OTC 20 mg daily Allergic rhinitis -- takes allergy medication OTC as needed  Anxiety -- takes lexapro 10 mg daily and prn alprazolam at bedtime only for anxiety/sleep Paperwork to complete for DOT physical    Current Medication: Outpatient Encounter Medications as of 01/20/2024  Medication Sig   fluticasone (FLONASE) 50 MCG/ACT nasal spray Place 2 sprays into both nostrils daily.   omega-3 acid ethyl esters (LOVAZA) 1 g capsule Take 2 capsules (2 g total) by mouth daily.   omeprazole (PRILOSEC) 20 MG capsule Take 1 capsule (20 mg total) by mouth daily.   vitamin B-12 (CYANOCOBALAMIN) 1000 MCG tablet Take 1,000 mcg by mouth daily.   Vitamin D3 (VITAMIN D) 25 MCG tablet Take 1,000 Units by mouth daily.   [DISCONTINUED] albuterol (VENTOLIN HFA) 108 (90 Base) MCG/ACT inhaler Inhale 1-2 puffs into the lungs every 6 (six) hours as needed for wheezing or shortness of breath.   [DISCONTINUED] ALPRAZolam (XANAX) 0.5 MG tablet Take 1 tablet (0.5 mg total) by mouth at bedtime as needed for anxiety or sleep.   [DISCONTINUED] escitalopram (LEXAPRO) 10 MG tablet TAKE 1 TABLET(10 MG) BY MOUTH DAILY   [DISCONTINUED] predniSONE (DELTASONE) 10 MG tablet Take one tab 3 x day for 3 days, then take one tab 2 x a day for 3 days and then take one tab a day for 3 days for wheezing   ALPRAZolam (XANAX) 0.5 MG tablet Take 1 tablet (0.5 mg total) by mouth at bedtime as needed for anxiety or sleep.   escitalopram (LEXAPRO) 10 MG tablet Take 1 tablet (10 mg total) by mouth daily.   No facility-administered  encounter medications on file as of 01/20/2024.    Surgical History: Past Surgical History:  Procedure Laterality Date   no surgical history      Medical History: Past Medical History:  Diagnosis Date   Anxiety    Anxiety    Family history of colon cancer    Family history of leukemia    Family history of Lynch syndrome    Family history of prostate cancer     Family History: Family History  Problem Relation Age of Onset   Healthy Mother    Colon cancer Father        MSH2+ Lynch syndrome, CHEK2+   Colon cancer Paternal Uncle 71   Colon cancer Paternal Grandfather        "Lynch+"   Prostate cancer Paternal Grandfather        dx 61s   Leukemia Half-Brother 24   Colon cancer Half-Brother 65       "Lynch+"    Social History   Socioeconomic History   Marital status: Married    Spouse name: Not on file   Number of children: Not on file   Years of education: Not on file   Highest education level: Not on file  Occupational History   Not on file  Tobacco Use   Smoking status: Every Day    Current packs/day: 0.00    Types: Cigarettes    Start date: 11/28/2006    Last attempt to quit:  11/28/2016    Years since quitting: 7.1   Smokeless tobacco: Never   Tobacco comments:    1/2 pack-1 pack daily  Vaping Use   Vaping status: Never Used  Substance and Sexual Activity   Alcohol use: No   Drug use: No   Sexual activity: Not on file  Other Topics Concern   Not on file  Social History Narrative   Not on file   Social Drivers of Health   Financial Resource Strain: Not on file  Food Insecurity: Not on file  Transportation Needs: Not on file  Physical Activity: Not on file  Stress: Not on file  Social Connections: Not on file  Intimate Partner Violence: Not on file      Review of Systems  Constitutional:  Positive for fatigue. Negative for fever.  HENT:  Negative for congestion, mouth sores and postnasal drip.   Respiratory:  Negative for cough and shortness  of breath.   Cardiovascular:  Negative for chest pain, palpitations and leg swelling.  Genitourinary:  Negative for flank pain.  Psychiatric/Behavioral:  Positive for sleep disturbance. The patient is nervous/anxious.     Vital Signs: BP 120/88   Pulse 84   Temp 98.6 F (37 C)   Resp 16   Ht 5\' 9"  (1.753 m)   Wt 249 lb 12.8 oz (113.3 kg)   SpO2 97%   BMI 36.89 kg/m    Physical Exam Vitals reviewed.  Constitutional:      General: He is not in acute distress.    Appearance: Normal appearance. He is obese. He is not ill-appearing.  HENT:     Head: Normocephalic and atraumatic.  Eyes:     Pupils: Pupils are equal, round, and reactive to light.  Cardiovascular:     Rate and Rhythm: Normal rate and regular rhythm.  Pulmonary:     Effort: Pulmonary effort is normal. No respiratory distress.  Neurological:     Mental Status: He is alert and oriented to person, place, and time.  Psychiatric:        Mood and Affect: Mood normal.        Behavior: Behavior normal.        Assessment/Plan: 1. Gastroesophageal reflux disease without esophagitis Continue omeprazole as prescribed.   2. Seasonal allergic rhinitis due to pollen (Primary) Recommend OTC medication such as claritin, zyrtec, allegra or xyzal daily.   3. GAD (generalized anxiety disorder) Continue lexapro and prn alprazolam as prescribed. Follow up in 3 months for additional refills of alprazolam.  - ALPRAZolam (XANAX) 0.5 MG tablet; Take 1 tablet (0.5 mg total) by mouth at bedtime as needed for anxiety or sleep.  Dispense: 60 tablet; Refill: 0 - escitalopram (LEXAPRO) 10 MG tablet; Take 1 tablet (10 mg total) by mouth daily.  Dispense: 90 tablet; Refill: 1   General Counseling: Alfred Gross verbalizes understanding of the findings of todays visit and agrees with plan of treatment. I have discussed any further diagnostic evaluation that may be needed or ordered today. We also reviewed his medications today. he has been  encouraged to call the office with any questions or concerns that should arise related to todays visit.    No orders of the defined types were placed in this encounter.   Meds ordered this encounter  Medications   ALPRAZolam (XANAX) 0.5 MG tablet    Sig: Take 1 tablet (0.5 mg total) by mouth at bedtime as needed for anxiety or sleep.    Dispense:  60 tablet  Refill:  0   escitalopram (LEXAPRO) 10 MG tablet    Sig: Take 1 tablet (10 mg total) by mouth daily.    Dispense:  90 tablet    Refill:  1    Return in about 3 months (around 04/21/2024) for F/U, anxiety med refill, Anayansi Rundquist PCP.   Total time spent:30 Minutes Time spent includes review of chart, medications, test results, and follow up plan with the patient.   [place] Controlled Substance Database was reviewed by me.  This patient was seen by Sallyanne Kuster, FNP-C in collaboration with Dr. Beverely Risen as a part of collaborative care agreement.   Cathye Kreiter R. Tedd Sias, MSN, FNP-C Internal medicine

## 2024-02-02 ENCOUNTER — Encounter: Payer: Self-pay | Admitting: Nurse Practitioner

## 2024-02-13 ENCOUNTER — Ambulatory Visit: Admitting: Nurse Practitioner

## 2024-02-13 ENCOUNTER — Encounter: Payer: Self-pay | Admitting: Nurse Practitioner

## 2024-02-13 VITALS — BP 128/88 | HR 85 | Temp 98.6°F | Resp 16 | Ht 69.0 in | Wt 254.2 lb

## 2024-02-13 DIAGNOSIS — J0101 Acute recurrent maxillary sinusitis: Secondary | ICD-10-CM

## 2024-02-13 MED ORDER — DOXYCYCLINE HYCLATE 100 MG PO TABS
100.0000 mg | ORAL_TABLET | Freq: Two times a day (BID) | ORAL | 0 refills | Status: AC
Start: 2024-02-13 — End: 2024-02-23

## 2024-02-13 MED ORDER — PREDNISONE 10 MG (21) PO TBPK
ORAL_TABLET | ORAL | 0 refills | Status: DC
Start: 2024-02-13 — End: 2024-05-07

## 2024-02-13 NOTE — Progress Notes (Addendum)
 [facility] [address] [place], [place] [address]  Internal MEDICINE  Office Visit Note  Patient Name: Alfred Gross  [id number]  [id number]  Date of Service: 02/13/2024  Chief Complaint  Patient presents with   Acute Visit    Sinus issues     HPI Alfred Gross presents for an acute sick visit for symptoms of sinusitis --onset of symptoms was last week  --nasal congestion, runny nose, dry mouth, cough, sinus pressure/pain, ear pain, headache, SOB, chest tightness, wheezing, body ache, fatigue.  --taking mucinex, allergy medication, nasal spray    Current Medication:  Outpatient Encounter Medications as of 02/13/2024  Medication Sig   ALPRAZolam  (XANAX ) 0.5 MG tablet Take 1 tablet (0.5 mg total) by mouth at bedtime as needed for anxiety or sleep.   doxycycline  (VIBRA -TABS) 100 MG tablet Take 1 tablet (100 mg total) by mouth 2 (two) times daily for 10 days.   escitalopram  (LEXAPRO ) 10 MG tablet Take 1 tablet (10 mg total) by mouth daily.   fluticasone  (FLONASE ) 50 MCG/ACT nasal spray Place 2 sprays into both nostrils daily.   omega-3 acid ethyl esters (LOVAZA ) 1 g capsule Take 2 capsules (2 g total) by mouth daily.   omeprazole  (PRILOSEC) 20 MG capsule Take 1 capsule (20 mg total) by mouth daily.   predniSONE  (STERAPRED UNI-PAK 21 TAB) 10 MG (21) TBPK tablet Use as directed for 6 days   vitamin B-12 (CYANOCOBALAMIN) 1000 MCG tablet Take 1,000 mcg by mouth daily.   Vitamin D3 (VITAMIN D ) 25 MCG tablet Take 1,000 Units by mouth daily.   No facility-administered encounter medications on file as of 02/13/2024.      Medical History: Past Medical History:  Diagnosis Date   Anxiety    Anxiety    Family history of colon cancer    Family history of leukemia    Family history of Lynch syndrome    Family history of prostate cancer      Vital Signs: BP 128/88   Pulse 85   Temp 98.6 F (37 C)   Resp 16   Ht 5\' 9"  (1.753 m)   Wt 254 lb 3.2 oz (115.3 kg)    SpO2 95%   BMI 37.54 kg/m    Review of Systems  Constitutional:  Positive for fatigue. Negative for chills and fever.  HENT:  Positive for congestion, ear pain, postnasal drip, rhinorrhea, sinus pressure, sinus pain and sore throat.   Respiratory:  Positive for cough and shortness of breath. Negative for chest tightness and wheezing.   Cardiovascular: Negative.  Negative for chest pain and palpitations.  Neurological:  Negative for headaches.    Physical Exam Vitals reviewed.  Constitutional:      Appearance: Normal appearance. He is obese. He is ill-appearing.  HENT:     Head: Normocephalic and atraumatic.     Right Ear: Tympanic membrane, ear canal and external ear normal.     Left Ear: Tympanic membrane, ear canal and external ear normal.     Nose: Congestion and rhinorrhea present.     Mouth/Throat:     Mouth: Mucous membranes are moist.     Pharynx: Posterior oropharyngeal erythema present.  Eyes:     Pupils: Pupils are equal, round, and reactive to light.  Cardiovascular:     Rate and Rhythm: Normal rate and regular rhythm.     Heart sounds: Normal heart sounds. No murmur heard. Pulmonary:     Effort: Pulmonary effort is normal. No respiratory distress.  Breath sounds: Normal breath sounds. No wheezing.  Skin:    General: Skin is warm and dry.     Capillary Refill: Capillary refill takes less than 2 seconds.  Neurological:     Mental Status: He is alert and oriented to person, place, and time.  Psychiatric:        Mood and Affect: Mood normal.        Behavior: Behavior normal.       Assessment/Plan: 1. Acute recurrent maxillary sinusitis (Primary) Doxycycline  and prednisone  prescribed, take until gone.  - doxycycline  (VIBRA -TABS) 100 MG tablet; Take 1 tablet (100 mg total) by mouth 2 (two) times daily for 10 days.  Dispense: 20 tablet; Refill: 0 - predniSONE  (STERAPRED UNI-PAK 21 TAB) 10 MG (21) TBPK tablet; Use as directed for 6 days  Dispense: 21  tablet; Refill: 0   General Counseling: Alfred Gross verbalizes understanding of the findings of todays visit and agrees with plan of treatment. I have discussed any further diagnostic evaluation that may be needed or ordered today. We also reviewed his medications today. he has been encouraged to call the office with any questions or concerns that should arise related to todays visit.    Counseling:    No orders of the defined types were placed in this encounter.   Meds ordered this encounter  Medications   doxycycline  (VIBRA -TABS) 100 MG tablet    Sig: Take 1 tablet (100 mg total) by mouth 2 (two) times daily for 10 days.    Dispense:  20 tablet    Refill:  0    Fill new script today   predniSONE  (STERAPRED UNI-PAK 21 TAB) 10 MG (21) TBPK tablet    Sig: Use as directed for 6 days    Dispense:  21 tablet    Refill:  0    Fill new script today    Return if symptoms worsen or fail to improve.  [place] Controlled Substance Database was reviewed by me for overdose risk score (ORS)  Time spent:20 Minutes Time spent with patient included reviewing progress notes, labs, imaging studies, and discussing plan for follow up.   This patient was seen by Laurence Pons, FNP-C in collaboration with Dr. Verneta Gone as a part of collaborative care agreement.  Venda Dice R. Bobbi Burow, MSN, FNP-C Internal Medicine

## 2024-02-16 ENCOUNTER — Encounter: Payer: Self-pay | Admitting: Nurse Practitioner

## 2024-05-07 ENCOUNTER — Encounter: Payer: Self-pay | Admitting: Nurse Practitioner

## 2024-05-07 ENCOUNTER — Ambulatory Visit (INDEPENDENT_AMBULATORY_CARE_PROVIDER_SITE_OTHER): Payer: Self-pay | Admitting: Nurse Practitioner

## 2024-05-07 VITALS — BP 128/88 | HR 88 | Temp 97.3°F | Resp 16 | Ht 69.0 in | Wt 247.8 lb

## 2024-05-07 DIAGNOSIS — Z0001 Encounter for general adult medical examination with abnormal findings: Secondary | ICD-10-CM

## 2024-05-07 DIAGNOSIS — R5383 Other fatigue: Secondary | ICD-10-CM | POA: Diagnosis not present

## 2024-05-07 DIAGNOSIS — E538 Deficiency of other specified B group vitamins: Secondary | ICD-10-CM

## 2024-05-07 DIAGNOSIS — F411 Generalized anxiety disorder: Secondary | ICD-10-CM

## 2024-05-07 DIAGNOSIS — K219 Gastro-esophageal reflux disease without esophagitis: Secondary | ICD-10-CM

## 2024-05-07 DIAGNOSIS — E782 Mixed hyperlipidemia: Secondary | ICD-10-CM

## 2024-05-07 DIAGNOSIS — E559 Vitamin D deficiency, unspecified: Secondary | ICD-10-CM

## 2024-05-07 DIAGNOSIS — J301 Allergic rhinitis due to pollen: Secondary | ICD-10-CM | POA: Diagnosis not present

## 2024-05-07 DIAGNOSIS — G4710 Hypersomnia, unspecified: Secondary | ICD-10-CM | POA: Diagnosis not present

## 2024-05-07 MED ORDER — ESCITALOPRAM OXALATE 10 MG PO TABS: 10.0000 mg | ORAL_TABLET | Freq: Every day | ORAL | 1 refills | Status: DC

## 2024-05-07 MED ORDER — OMEGA-3-ACID ETHYL ESTERS 1 G PO CAPS: 2.0000 g | ORAL_CAPSULE | Freq: Every day | ORAL | 11 refills | Status: AC

## 2024-05-07 MED ORDER — ALPRAZOLAM 0.5 MG PO TABS: 0.5000 mg | ORAL_TABLET | Freq: Every evening | ORAL | 0 refills | Status: AC | PRN

## 2024-05-07 NOTE — Progress Notes (Signed)
 [facility] [address] [place], [place] [address]  Internal MEDICINE  Office Visit Note  Patient Name: Alfred Gross  [id number]  [id number]  Date of Service: 05/07/2024  Chief Complaint  Patient presents with   Annual Exam    HPI Alfred Gross presents for an annual well visit and physical exam.  Well-appearing 33 y.o. male with allergic rhinitis, GERD, high cholesterol and anxiety.  Routine CRC screening: not due yet  Labs: routine labs ordered  New or worsening pain: none  Other concerns: none Taking lexapro  and prn alprazolam  for anxiety.  No issues with kidney stones since last office visit.  High cholesterol -- taking lovaza    Current Medication: Outpatient Encounter Medications as of 05/07/2024  Medication Sig   ALPRAZolam  (XANAX ) 0.5 MG tablet Take 1 tablet (0.5 mg total) by mouth at bedtime as needed for anxiety or sleep.   escitalopram  (LEXAPRO ) 10 MG tablet Take 1 tablet (10 mg total) by mouth daily.   fluticasone  (FLONASE ) 50 MCG/ACT nasal spray Place 2 sprays into both nostrils daily.   omega-3 acid ethyl esters (LOVAZA ) 1 g capsule Take 2 capsules (2 g total) by mouth daily.   omeprazole  (PRILOSEC) 20 MG capsule Take 1 capsule (20 mg total) by mouth daily.   vitamin B-12 (CYANOCOBALAMIN) 1000 MCG tablet Take 1,000 mcg by mouth daily.   Vitamin D3 (VITAMIN D ) 25 MCG tablet Take 1,000 Units by mouth daily.   [DISCONTINUED] ALPRAZolam  (XANAX ) 0.5 MG tablet Take 1 tablet (0.5 mg total) by mouth at bedtime as needed for anxiety or sleep.   [DISCONTINUED] escitalopram  (LEXAPRO ) 10 MG tablet Take 1 tablet (10 mg total) by mouth daily.   [DISCONTINUED] omega-3 acid ethyl esters (LOVAZA ) 1 g capsule Take 2 capsules (2 g total) by mouth daily.   [DISCONTINUED] predniSONE  (STERAPRED UNI-PAK 21 TAB) 10 MG (21) TBPK tablet Use as directed for 6 days   No facility-administered encounter medications on file as of 05/07/2024.    Surgical History: Past  Surgical History:  Procedure Laterality Date   no surgical history      Medical History: Past Medical History:  Diagnosis Date   Anxiety    Anxiety    Family history of colon cancer    Family history of leukemia    Family history of Lynch syndrome    Family history of prostate cancer     Family History: Family History  Problem Relation Age of Onset   Healthy Mother    Colon cancer Father        MSH2+ Lynch syndrome, CHEK2+   Colon cancer Paternal Uncle 50   Colon cancer Paternal Grandfather        Lynch+   Prostate cancer Paternal Grandfather        dx 84s   Leukemia Half-Brother 8   Colon cancer Half-Brother 87       Lynch+    Social History   Socioeconomic History   Marital status: Married    Spouse name: Not on file   Number of children: Not on file   Years of education: Not on file   Highest education level: Not on file  Occupational History   Not on file  Tobacco Use   Smoking status: Every Day    Current packs/day: 0.00    Types: Cigarettes    Start date: 11/28/2006    Last attempt to quit: 11/28/2016    Years since quitting: 7.4   Smokeless tobacco: Never   Tobacco comments:  1/2 pack-1 pack daily  Vaping Use   Vaping status: Never Used  Substance and Sexual Activity   Alcohol use: No   Drug use: No   Sexual activity: Not on file  Other Topics Concern   Not on file  Social History Narrative   Not on file   Social Drivers of Health   Financial Resource Strain: Not on file  Food Insecurity: Not on file  Transportation Needs: Not on file  Physical Activity: Not on file  Stress: Not on file  Social Connections: Not on file  Intimate Partner Violence: Not on file      Review of Systems  Constitutional:  Positive for fatigue. Negative for activity change, appetite change, chills, fever and unexpected weight change.  HENT: Negative.  Negative for congestion, ear pain, rhinorrhea, sore throat and trouble swallowing.   Eyes: Negative.    Respiratory: Negative.  Negative for cough, chest tightness, shortness of breath and wheezing.   Cardiovascular:  Positive for palpitations. Negative for chest pain.  Gastrointestinal: Negative.  Negative for abdominal pain, blood in stool, constipation, diarrhea, nausea and vomiting.  Endocrine: Negative.   Genitourinary: Negative.  Negative for difficulty urinating, dysuria, frequency, hematuria and urgency.  Musculoskeletal: Negative.  Negative for arthralgias, back pain, joint swelling, myalgias and neck pain.  Skin: Negative.  Negative for rash and wound.  Allergic/Immunologic: Negative.  Negative for immunocompromised state.  Neurological: Negative.  Negative for dizziness, seizures, numbness and headaches.  Hematological: Negative.   Psychiatric/Behavioral:  Positive for sleep disturbance. Negative for behavioral problems, self-injury and suicidal ideas. The patient is nervous/anxious.     Vital Signs: BP 128/88   Pulse 88   Temp (!) 97.3 F (36.3 C)   Resp 16   Ht 5' 9 (1.753 m)   Wt 247 lb 12.8 oz (112.4 kg)   SpO2 96%   BMI 36.59 kg/m    Physical Exam Vitals reviewed.  Constitutional:      General: He is awake. He is not in acute distress.    Appearance: Normal appearance. He is well-developed and well-groomed. He is obese. He is not ill-appearing or diaphoretic.  HENT:     Head: Normocephalic and atraumatic.     Right Ear: Tympanic membrane, ear canal and external ear normal.     Left Ear: Tympanic membrane, ear canal and external ear normal.     Nose: Nose normal. No congestion or rhinorrhea.     Mouth/Throat:     Lips: Pink.     Mouth: Mucous membranes are moist.     Pharynx: Oropharynx is clear. Uvula midline. No oropharyngeal exudate or posterior oropharyngeal erythema.  Eyes:     General: Lids are normal. Vision grossly intact. Gaze aligned appropriately. No scleral icterus.       Right eye: No discharge.        Left eye: No discharge.     Extraocular  Movements: Extraocular movements intact.     Conjunctiva/sclera: Conjunctivae normal.     Pupils: Pupils are equal, round, and reactive to light.     Funduscopic exam:    Right eye: Red reflex present.        Left eye: Red reflex present. Neck:     Thyroid: No thyromegaly.     Vascular: No carotid bruit or JVD.     Trachea: Trachea and phonation normal. No tracheal deviation.  Cardiovascular:     Rate and Rhythm: Normal rate and regular rhythm.     Pulses: Normal pulses.  Heart sounds: Normal heart sounds, S1 normal and S2 normal. No murmur heard.    No friction rub. No gallop.  Pulmonary:     Effort: Pulmonary effort is normal. No accessory muscle usage or respiratory distress.     Breath sounds: Normal breath sounds and air entry. No stridor. No wheezing or rales.  Chest:     Chest wall: No tenderness.  Abdominal:     General: Bowel sounds are normal. There is no distension.     Palpations: Abdomen is soft. There is no shifting dullness, fluid wave, mass or pulsatile mass.     Tenderness: There is no abdominal tenderness. There is no guarding or rebound.  Musculoskeletal:        General: No tenderness or deformity. Normal range of motion.     Cervical back: Normal range of motion and neck supple.     Right lower leg: No edema.     Left lower leg: No edema.  Lymphadenopathy:     Cervical: No cervical adenopathy.  Skin:    General: Skin is warm and dry.     Capillary Refill: Capillary refill takes less than 2 seconds.     Coloration: Skin is not pale.     Findings: No erythema or rash.  Neurological:     Mental Status: He is alert and oriented to person, place, and time.     Cranial Nerves: No cranial nerve deficit.     Motor: No abnormal muscle tone.     Coordination: Coordination normal.     Deep Tendon Reflexes: Reflexes are normal and symmetric.  Psychiatric:        Mood and Affect: Affect normal. Mood is anxious.        Speech: Speech normal.        Behavior:  Behavior normal. Behavior is cooperative.        Thought Content: Thought content normal. Thought content is not paranoid.        Judgment: Judgment normal.        Assessment/Plan: 1. Encounter for routine adult health examination with abnormal findings (Primary) Age-appropriate preventive screenings and vaccinations discussed, annual physical exam completed. Routine labs for health maintenance ordered, see below. PHM updated.   - ALPRAZolam  (XANAX ) 0.5 MG tablet; Take 1 tablet (0.5 mg total) by mouth at bedtime as needed for anxiety or sleep.  Dispense: 60 tablet; Refill: 0 - escitalopram  (LEXAPRO ) 10 MG tablet; Take 1 tablet (10 mg total) by mouth daily.  Dispense: 90 tablet; Refill: 1 - omega-3 acid ethyl esters (LOVAZA ) 1 g capsule; Take 2 capsules (2 g total) by mouth daily.  Dispense: 60 capsule; Refill: 11 - CBC with Differential/Platelet - CMP14+EGFR - Lipid Profile - Vitamin D  (25 hydroxy) - B12 and Folate Panel - Iron, TIBC and Ferritin Panel - TSH + free T4  2. Other fatigue Routine labs ordered  - ALPRAZolam  (XANAX ) 0.5 MG tablet; Take 1 tablet (0.5 mg total) by mouth at bedtime as needed for anxiety or sleep.  Dispense: 60 tablet; Refill: 0 - escitalopram  (LEXAPRO ) 10 MG tablet; Take 1 tablet (10 mg total) by mouth daily.  Dispense: 90 tablet; Refill: 1 - omega-3 acid ethyl esters (LOVAZA ) 1 g capsule; Take 2 capsules (2 g total) by mouth daily.  Dispense: 60 capsule; Refill: 11 - CBC with Differential/Platelet - CMP14+EGFR - Lipid Profile - Vitamin D  (25 hydroxy) - B12 and Folate Panel - Iron, TIBC and Ferritin Panel - TSH + free T4  3. Mixed  hyperlipidemia Routine labs ordered.  - ALPRAZolam  (XANAX ) 0.5 MG tablet; Take 1 tablet (0.5 mg total) by mouth at bedtime as needed for anxiety or sleep.  Dispense: 60 tablet; Refill: 0 - escitalopram  (LEXAPRO ) 10 MG tablet; Take 1 tablet (10 mg total) by mouth daily.  Dispense: 90 tablet; Refill: 1 - omega-3 acid ethyl  esters (LOVAZA ) 1 g capsule; Take 2 capsules (2 g total) by mouth daily.  Dispense: 60 capsule; Refill: 11 - CBC with Differential/Platelet - CMP14+EGFR - Lipid Profile - Vitamin D  (25 hydroxy) - B12 and Folate Panel - Iron, TIBC and Ferritin Panel - TSH + free T4  4. Seasonal allergic rhinitis due to pollen Continue medications as prescribed.  - ALPRAZolam  (XANAX ) 0.5 MG tablet; Take 1 tablet (0.5 mg total) by mouth at bedtime as needed for anxiety or sleep.  Dispense: 60 tablet; Refill: 0 - escitalopram  (LEXAPRO ) 10 MG tablet; Take 1 tablet (10 mg total) by mouth daily.  Dispense: 90 tablet; Refill: 1 - omega-3 acid ethyl esters (LOVAZA ) 1 g capsule; Take 2 capsules (2 g total) by mouth daily.  Dispense: 60 capsule; Refill: 11 - CBC with Differential/Platelet - CMP14+EGFR - Lipid Profile - Vitamin D  (25 hydroxy) - B12 and Folate Panel - Iron, TIBC and Ferritin Panel - TSH + free T4  5. Hypersomnia Continue medications as prescribed. Routine labs ordered  - ALPRAZolam  (XANAX ) 0.5 MG tablet; Take 1 tablet (0.5 mg total) by mouth at bedtime as needed for anxiety or sleep.  Dispense: 60 tablet; Refill: 0 - escitalopram  (LEXAPRO ) 10 MG tablet; Take 1 tablet (10 mg total) by mouth daily.  Dispense: 90 tablet; Refill: 1 - omega-3 acid ethyl esters (LOVAZA ) 1 g capsule; Take 2 capsules (2 g total) by mouth daily.  Dispense: 60 capsule; Refill: 11 - CBC with Differential/Platelet - CMP14+EGFR - Lipid Profile - Vitamin D  (25 hydroxy) - B12 and Folate Panel - Iron, TIBC and Ferritin Panel - TSH + free T4  6. Gastroesophageal reflux disease without esophagitis Continue omeprazole  as prescribed.   7. B12 deficiency Routine labs ordered  - CBC with Differential/Platelet - CMP14+EGFR - Lipid Profile - Vitamin D  (25 hydroxy) - B12 and Folate Panel - Iron, TIBC and Ferritin Panel - TSH + free T4  8. Vitamin D  deficiency Routine labs ordered  - CBC with Differential/Platelet -  CMP14+EGFR - Lipid Profile - Vitamin D  (25 hydroxy) - B12 and Folate Panel - Iron, TIBC and Ferritin Panel - TSH + free T4  9. GAD (generalized anxiety disorder) Continue lexapro  as prescribed. Continue prn alprazolam  as prescribed.  - ALPRAZolam  (XANAX ) 0.5 MG tablet; Take 1 tablet (0.5 mg total) by mouth at bedtime as needed for anxiety or sleep.  Dispense: 60 tablet; Refill: 0 - escitalopram  (LEXAPRO ) 10 MG tablet; Take 1 tablet (10 mg total) by mouth daily.  Dispense: 90 tablet; Refill: 1     General Counseling: Franko verbalizes understanding of the findings of todays visit and agrees with plan of treatment. I have discussed any further diagnostic evaluation that may be needed or ordered today. We also reviewed his medications today. he has been encouraged to call the office with any questions or concerns that should arise related to todays visit.    Orders Placed This Encounter  Procedures   CBC with Differential/Platelet   CMP14+EGFR   Lipid Profile   Vitamin D  (25 hydroxy)   B12 and Folate Panel   Iron, TIBC and Ferritin Panel   TSH + free  T4    Meds ordered this encounter  Medications   ALPRAZolam  (XANAX ) 0.5 MG tablet    Sig: Take 1 tablet (0.5 mg total) by mouth at bedtime as needed for anxiety or sleep.    Dispense:  60 tablet    Refill:  0   escitalopram  (LEXAPRO ) 10 MG tablet    Sig: Take 1 tablet (10 mg total) by mouth daily.    Dispense:  90 tablet    Refill:  1   omega-3 acid ethyl esters (LOVAZA ) 1 g capsule    Sig: Take 2 capsules (2 g total) by mouth daily.    Dispense:  60 capsule    Refill:  11    Return in about 1 month (around 06/07/2024) for F/U, Labs, Zeb Rawl PCP.   Total time spent:30 Minutes Time spent includes review of chart, medications, test results, and follow up plan with the patient.   [place] Controlled Substance Database was reviewed by me.  This patient was seen by Mardy Maxin, FNP-C in collaboration with Dr. Sigrid Bathe as  a part of collaborative care agreement.  Italia Wolfert R. Maxin, MSN, FNP-C Internal medicine

## 2024-05-16 ENCOUNTER — Encounter: Payer: Self-pay | Admitting: Nurse Practitioner

## 2024-05-16 DIAGNOSIS — G4710 Hypersomnia, unspecified: Secondary | ICD-10-CM | POA: Insufficient documentation

## 2024-05-19 LAB — CBC WITH DIFFERENTIAL/PLATELET
Basophils Absolute: 0.1 x10E3/uL (ref 0.0–0.2)
Basos: 1 %
EOS (ABSOLUTE): 0.4 x10E3/uL (ref 0.0–0.4)
Eos: 4 %
Hematocrit: 48.8 % (ref 37.5–51.0)
Hemoglobin: 16.5 g/dL (ref 13.0–17.7)
Immature Grans (Abs): 0.1 x10E3/uL (ref 0.0–0.1)
Immature Granulocytes: 1 %
Lymphocytes Absolute: 2.2 x10E3/uL (ref 0.7–3.1)
Lymphs: 24 %
MCH: 29.7 pg (ref 26.6–33.0)
MCHC: 33.8 g/dL (ref 31.5–35.7)
MCV: 88 fL (ref 79–97)
Monocytes Absolute: 0.8 x10E3/uL (ref 0.1–0.9)
Monocytes: 9 %
Neutrophils Absolute: 5.7 x10E3/uL (ref 1.4–7.0)
Neutrophils: 61 %
Platelets: 253 x10E3/uL (ref 150–450)
RBC: 5.56 x10E6/uL (ref 4.14–5.80)
RDW: 12.6 % (ref 11.6–15.4)
WBC: 9.3 x10E3/uL (ref 3.4–10.8)

## 2024-05-19 LAB — LIPID PANEL
Chol/HDL Ratio: 8.2 ratio — ABNORMAL HIGH (ref 0.0–5.0)
Cholesterol, Total: 229 mg/dL — ABNORMAL HIGH (ref 100–199)
HDL: 28 mg/dL — ABNORMAL LOW (ref >39)
LDL Chol Calc (NIH): 97 mg/dL (ref 0–99)
Triglycerides: 613 mg/dL (ref 0–149)
VLDL Cholesterol Cal: 104 mg/dL — ABNORMAL HIGH (ref 5–40)

## 2024-05-19 LAB — CMP14+EGFR
ALT: 65 IU/L — ABNORMAL HIGH (ref 0–44)
AST: 31 IU/L (ref 0–40)
Albumin: 4.5 g/dL (ref 4.1–5.1)
Alkaline Phosphatase: 97 IU/L (ref 44–121)
BUN/Creatinine Ratio: 14 (ref 9–20)
BUN: 11 mg/dL (ref 6–20)
Bilirubin Total: 0.3 mg/dL (ref 0.0–1.2)
CO2: 19 mmol/L — ABNORMAL LOW (ref 20–29)
Calcium: 9.6 mg/dL (ref 8.7–10.2)
Chloride: 103 mmol/L (ref 96–106)
Creatinine, Ser: 0.79 mg/dL (ref 0.76–1.27)
Globulin, Total: 2.6 g/dL (ref 1.5–4.5)
Glucose: 205 mg/dL — ABNORMAL HIGH (ref 70–99)
Potassium: 4.5 mmol/L (ref 3.5–5.2)
Sodium: 138 mmol/L (ref 134–144)
Total Protein: 7.1 g/dL (ref 6.0–8.5)
eGFR: 120 mL/min/1.73 (ref >59)

## 2024-05-19 LAB — B12 AND FOLATE PANEL
Folate: 6.1 ng/mL (ref >3.0)
Vitamin B-12: 356 pg/mL (ref 232–1245)

## 2024-05-19 LAB — IRON,TIBC AND FERRITIN PANEL
Ferritin: 474 ng/mL — ABNORMAL HIGH (ref 30–400)
Iron Saturation: 27 % (ref 15–55)
Iron: 78 ug/dL (ref 38–169)
Total Iron Binding Capacity: 286 ug/dL (ref 250–450)
UIBC: 208 ug/dL (ref 111–343)

## 2024-05-19 LAB — TSH+FREE T4
Free T4: 1.03 ng/dL (ref 0.82–1.77)
TSH: 1.57 u[IU]/mL (ref 0.450–4.500)

## 2024-05-19 LAB — VITAMIN D 25 HYDROXY (VIT D DEFICIENCY, FRACTURES): Vit D, 25-Hydroxy: 23.4 ng/mL — ABNORMAL LOW (ref 30.0–100.0)

## 2024-06-17 ENCOUNTER — Encounter: Payer: Self-pay | Admitting: Nurse Practitioner

## 2024-06-17 ENCOUNTER — Ambulatory Visit (INDEPENDENT_AMBULATORY_CARE_PROVIDER_SITE_OTHER): Admitting: Nurse Practitioner

## 2024-06-17 VITALS — BP 114/84 | HR 85 | Temp 98.4°F | Resp 16 | Ht 69.0 in | Wt 239.2 lb

## 2024-06-17 DIAGNOSIS — E538 Deficiency of other specified B group vitamins: Secondary | ICD-10-CM

## 2024-06-17 DIAGNOSIS — G4710 Hypersomnia, unspecified: Secondary | ICD-10-CM | POA: Diagnosis not present

## 2024-06-17 DIAGNOSIS — R7301 Impaired fasting glucose: Secondary | ICD-10-CM

## 2024-06-17 DIAGNOSIS — E119 Type 2 diabetes mellitus without complications: Secondary | ICD-10-CM | POA: Diagnosis not present

## 2024-06-17 DIAGNOSIS — F411 Generalized anxiety disorder: Secondary | ICD-10-CM

## 2024-06-17 DIAGNOSIS — R5383 Other fatigue: Secondary | ICD-10-CM | POA: Diagnosis not present

## 2024-06-17 DIAGNOSIS — E782 Mixed hyperlipidemia: Secondary | ICD-10-CM | POA: Diagnosis not present

## 2024-06-17 DIAGNOSIS — E559 Vitamin D deficiency, unspecified: Secondary | ICD-10-CM

## 2024-06-17 DIAGNOSIS — J301 Allergic rhinitis due to pollen: Secondary | ICD-10-CM

## 2024-06-17 DIAGNOSIS — R7989 Other specified abnormal findings of blood chemistry: Secondary | ICD-10-CM

## 2024-06-17 LAB — POCT GLYCOSYLATED HEMOGLOBIN (HGB A1C): Hemoglobin A1C: 6.7 % — AB (ref 4.0–5.6)

## 2024-06-17 NOTE — Progress Notes (Signed)
 [facility] [address] [place], [place] [address]  Internal MEDICINE  Office Visit Note  Patient Name: Alfred Gross  [id number]  [id number]  Date of Service: 06/17/2024  Chief Complaint  Patient presents with   Follow-up    HPI Alfred Gross presents for a follow-up visit for lab results  Elevated glucose of 205 on labs, he did drink about 8 oz of regular coke about 4 hours before he had his labs drawn Triglycerides are elevated at 613, but LDL is improved to normal range.  Elevated ALT Vitamin D  is slightly low Elevated ferritin but normal iron level.  Low normal B12.  He is working on diet now and has lost almost 20 lbs. He reports that he was eating fried foods and eating fast food up to 3 times a day. But he has stopped this now.    Current Medication: Outpatient Encounter Medications as of 06/17/2024  Medication Sig   ALPRAZolam  (XANAX ) 0.5 MG tablet Take 1 tablet (0.5 mg total) by mouth at bedtime as needed for anxiety or sleep.   escitalopram  (LEXAPRO ) 10 MG tablet Take 1 tablet (10 mg total) by mouth daily.   fluticasone  (FLONASE ) 50 MCG/ACT nasal spray Place 2 sprays into both nostrils daily.   omega-3 acid ethyl esters (LOVAZA ) 1 g capsule Take 2 capsules (2 g total) by mouth daily.   omeprazole  (PRILOSEC) 20 MG capsule Take 1 capsule (20 mg total) by mouth daily.   vitamin B-12 (CYANOCOBALAMIN) 1000 MCG tablet Take 1,000 mcg by mouth daily.   Vitamin D3 (VITAMIN D ) 25 MCG tablet Take 1,000 Units by mouth daily.   No facility-administered encounter medications on file as of 06/17/2024.    Surgical History: Past Surgical History:  Procedure Laterality Date   no surgical history      Medical History: Past Medical History:  Diagnosis Date   Anxiety    Anxiety    Family history of colon cancer    Family history of leukemia    Family history of Lynch syndrome    Family history of prostate cancer     Family History: Family History   Problem Relation Age of Onset   Healthy Mother    Colon cancer Father        MSH2+ Lynch syndrome, CHEK2+   Colon cancer Paternal Uncle 39   Colon cancer Paternal Grandfather        Lynch+   Prostate cancer Paternal Grandfather        dx 62s   Leukemia Half-Brother 81   Colon cancer Half-Brother 28       Lynch+    Social History   Socioeconomic History   Marital status: Married    Spouse name: Not on file   Number of children: Not on file   Years of education: Not on file   Highest education level: Not on file  Occupational History   Not on file  Tobacco Use   Smoking status: Every Day    Current packs/day: 0.00    Types: Cigarettes    Start date: 11/28/2006    Last attempt to quit: 11/28/2016    Years since quitting: 7.5   Smokeless tobacco: Never   Tobacco comments:    1/2 pack-1 pack daily  Vaping Use   Vaping status: Never Used  Substance and Sexual Activity   Alcohol use: No   Drug use: No   Sexual activity: Not on file  Other Topics Concern   Not on file  Social  History Narrative   Not on file   Social Drivers of Health   Financial Resource Strain: Not on file  Food Insecurity: Not on file  Transportation Needs: Not on file  Physical Activity: Not on file  Stress: Not on file  Social Connections: Not on file  Intimate Partner Violence: Not on file      Review of Systems  Constitutional:  Positive for fatigue. Negative for activity change, appetite change, chills, fever and unexpected weight change.  HENT: Negative.  Negative for congestion, ear pain, rhinorrhea, sore throat and trouble swallowing.   Eyes: Negative.   Respiratory: Negative.  Negative for cough, chest tightness, shortness of breath and wheezing.   Cardiovascular:  Positive for palpitations. Negative for chest pain.  Gastrointestinal: Negative.  Negative for abdominal pain, blood in stool, constipation, diarrhea, nausea and vomiting.  Endocrine: Negative.   Genitourinary:  Negative.  Negative for difficulty urinating, dysuria, frequency, hematuria and urgency.  Musculoskeletal: Negative.  Negative for arthralgias, back pain, joint swelling, myalgias and neck pain.  Skin: Negative.  Negative for rash and wound.  Allergic/Immunologic: Negative.  Negative for immunocompromised state.  Neurological: Negative.  Negative for dizziness, seizures, numbness and headaches.  Hematological: Negative.   Psychiatric/Behavioral:  Positive for sleep disturbance. Negative for behavioral problems, self-injury and suicidal ideas. The patient is nervous/anxious.     Vital Signs: BP 114/84   Pulse 85   Temp 98.4 F (36.9 C)   Resp 16   Ht 5' 9 (1.753 m)   Wt 239 lb 3.2 oz (108.5 kg)   SpO2 96%   BMI 35.32 kg/m    Physical Exam Vitals reviewed.  Constitutional:      General: He is not in acute distress.    Appearance: Normal appearance. He is obese. He is not ill-appearing.  HENT:     Head: Normocephalic and atraumatic.  Eyes:     Pupils: Pupils are equal, round, and reactive to light.  Cardiovascular:     Rate and Rhythm: Normal rate and regular rhythm.  Pulmonary:     Effort: Pulmonary effort is normal. No respiratory distress.  Neurological:     Mental Status: He is alert and oriented to person, place, and time.  Psychiatric:        Mood and Affect: Mood normal.        Behavior: Behavior normal.        Assessment/Plan: 1. Type 2 diabetes mellitus without complication, without long-term current use of insulin (HCC) (Primary) A1c is elevated at 6.7, which is in diabetic range. Patient will work on diet changes and physical activity as discussed in office today. Repeat labs will be done in 4 months.  - Hgb A1C w/o eAG - CMP14+EGFR - Lipid Profile  2. Mixed hyperlipidemia Repeat labs in 4 months  - Hgb A1C w/o eAG - CMP14+EGFR - Lipid Profile  3. Hypersomnia Work on good sleep hygiene.   4. Other fatigue Work on diet and physical activity as  discussed, will reassess in 4 months   5. Elevated ferritin Repeat labs ordered  - CMP14+EGFR - B12 and Folate Panel - Iron, TIBC and Ferritin Panel  6. B12 deficiency Repeat labs ordered  - B12 and Folate Panel - Iron, TIBC and Ferritin Panel  7. Vitamin D  deficiency Routine lab ordered  - Vitamin D  (25 hydroxy)  8. Impaired fasting glucose Elevated fasting glucose, A1c done today. Elevated at 6.7, patient will word on diet and exercise and repeat A1c in 4 months  -  POCT glycosylated hemoglobin (Hb A1C) - Hgb A1C w/o eAG  9. Seasonal allergic rhinitis due to pollen Continue medications as prescribed.   10. GAD (generalized anxiety disorder) Continue prn alprazolam  as prescribed.   General Counseling: isidoro santillana understanding of the findings of todays visit and agrees with plan of treatment. I have discussed any further diagnostic evaluation that may be needed or ordered today. We also reviewed his medications today. he has been encouraged to call the office with any questions or concerns that should arise related to todays visit.    Orders Placed This Encounter  Procedures   Hgb A1C w/o eAG   CMP14+EGFR   Lipid Profile   Vitamin D  (25 hydroxy)   B12 and Folate Panel   Iron, TIBC and Ferritin Panel   POCT glycosylated hemoglobin (Hb A1C)    No orders of the defined types were placed in this encounter.   Return in about 4 months (around 10/17/2024) for F/U, Labs, Kennetha Pearman PCP.   Total time spent:30 Minutes Time spent includes review of chart, medications, test results, and follow up plan with the patient.    Controlled Substance Database was reviewed by me.  This patient was seen by Mardy Maxin, FNP-C in collaboration with Dr. Sigrid Bathe as a part of collaborative care agreement.   Naveh Rickles R. Maxin, MSN, FNP-C Internal medicine

## 2024-08-01 ENCOUNTER — Encounter: Payer: Self-pay | Admitting: Nurse Practitioner

## 2024-09-23 LAB — B12 AND FOLATE PANEL
Folate: 6.7 ng/mL (ref >3.0)
Vitamin B-12: 355 pg/mL (ref 232–1245)

## 2024-09-23 LAB — LIPID PANEL
Chol/HDL Ratio: 5.3 ratio — ABNORMAL HIGH (ref 0.0–5.0)
Cholesterol, Total: 171 mg/dL (ref 100–199)
HDL: 32 mg/dL — ABNORMAL LOW (ref >39)
LDL Chol Calc (NIH): 115 mg/dL — ABNORMAL HIGH (ref 0–99)
Triglycerides: 133 mg/dL (ref 0–149)
VLDL Cholesterol Cal: 24 mg/dL (ref 5–40)

## 2024-09-23 LAB — CMP14+EGFR
ALT: 16 IU/L (ref 0–44)
AST: 16 IU/L (ref 0–40)
Albumin: 4.5 g/dL (ref 4.1–5.1)
Alkaline Phosphatase: 80 IU/L (ref 47–123)
BUN/Creatinine Ratio: 15 (ref 9–20)
BUN: 12 mg/dL (ref 6–20)
Bilirubin Total: 0.6 mg/dL (ref 0.0–1.2)
CO2: 25 mmol/L (ref 20–29)
Calcium: 9.5 mg/dL (ref 8.7–10.2)
Chloride: 103 mmol/L (ref 96–106)
Creatinine, Ser: 0.8 mg/dL (ref 0.76–1.27)
Globulin, Total: 2.3 g/dL (ref 1.5–4.5)
Glucose: 105 mg/dL — ABNORMAL HIGH (ref 70–99)
Potassium: 4.7 mmol/L (ref 3.5–5.2)
Sodium: 141 mmol/L (ref 134–144)
Total Protein: 6.8 g/dL (ref 6.0–8.5)
eGFR: 119 mL/min/1.73 (ref >59)

## 2024-09-23 LAB — IRON,TIBC AND FERRITIN PANEL
Ferritin: 245 ng/mL (ref 30–400)
Iron Saturation: 38 % (ref 15–55)
Iron: 109 ug/dL (ref 38–169)
Total Iron Binding Capacity: 286 ug/dL (ref 250–450)
UIBC: 177 ug/dL (ref 111–343)

## 2024-09-23 LAB — HGB A1C W/O EAG: Hgb A1c MFr Bld: 5.8 % — ABNORMAL HIGH (ref 4.8–5.6)

## 2024-09-23 LAB — VITAMIN D 25 HYDROXY (VIT D DEFICIENCY, FRACTURES): Vit D, 25-Hydroxy: 33.4 ng/mL (ref 30.0–100.0)

## 2024-10-15 ENCOUNTER — Ambulatory Visit: Admitting: Nurse Practitioner

## 2024-10-27 ENCOUNTER — Ambulatory Visit: Admitting: Nurse Practitioner

## 2024-10-27 ENCOUNTER — Encounter: Payer: Self-pay | Admitting: Nurse Practitioner

## 2024-10-27 VITALS — BP 126/78 | HR 72 | Temp 98.2°F | Resp 16 | Ht 69.0 in | Wt 217.0 lb

## 2024-10-27 DIAGNOSIS — E782 Mixed hyperlipidemia: Secondary | ICD-10-CM | POA: Diagnosis not present

## 2024-10-27 DIAGNOSIS — R7989 Other specified abnormal findings of blood chemistry: Secondary | ICD-10-CM | POA: Diagnosis not present

## 2024-10-27 DIAGNOSIS — F411 Generalized anxiety disorder: Secondary | ICD-10-CM | POA: Diagnosis not present

## 2024-10-27 DIAGNOSIS — R748 Abnormal levels of other serum enzymes: Secondary | ICD-10-CM | POA: Diagnosis not present

## 2024-10-27 DIAGNOSIS — E559 Vitamin D deficiency, unspecified: Secondary | ICD-10-CM

## 2024-10-27 DIAGNOSIS — E119 Type 2 diabetes mellitus without complications: Secondary | ICD-10-CM

## 2024-10-27 NOTE — Progress Notes (Signed)
 [facility] [address] [place], [place] [address]  Internal MEDICINE  Office Visit Note  Patient Name: Alfred Gross  [id number]  [id number]  Date of Service: 10/27/2024  Chief Complaint  Patient presents with   Follow-up    HPI Alfred Gross presents for a follow-up visit for lab results  Cholesterol greatly improved A1c greatly improved and fasting glucose dropped to 105.  Vitamin d  improved to normal range Ferrritin level improved to normal ALT improved to normal.     Current Medication: Outpatient Encounter Medications as of 10/27/2024  Medication Sig   ALPRAZolam  (XANAX ) 0.5 MG tablet Take 1 tablet (0.5 mg total) by mouth at bedtime as needed for anxiety or sleep.   escitalopram  (LEXAPRO ) 10 MG tablet Take 1 tablet (10 mg total) by mouth daily.   fluticasone  (FLONASE ) 50 MCG/ACT nasal spray Place 2 sprays into both nostrils daily.   omega-3 acid ethyl esters (LOVAZA ) 1 g capsule Take 2 capsules (2 g total) by mouth daily.   omeprazole  (PRILOSEC) 20 MG capsule Take 1 capsule (20 mg total) by mouth daily.   [DISCONTINUED] vitamin B-12 (CYANOCOBALAMIN) 1000 MCG tablet Take 1,000 mcg by mouth daily. (Patient not taking: Reported on 10/27/2024)   [DISCONTINUED] Vitamin D3 (VITAMIN D ) 25 MCG tablet Take 1,000 Units by mouth daily. (Patient not taking: Reported on 10/27/2024)   No facility-administered encounter medications on file as of 10/27/2024.    Surgical History: Past Surgical History:  Procedure Laterality Date   no surgical history      Medical History: Past Medical History:  Diagnosis Date   Anxiety    Anxiety    Family history of colon cancer    Family history of leukemia    Family history of Lynch syndrome    Family history of prostate cancer     Family History: Family History  Problem Relation Age of Onset   Healthy Mother    Colon cancer Father        MSH2+ Lynch syndrome, CHEK2+   Colon cancer Paternal Uncle 55   Colon  cancer Paternal Grandfather        Lynch+   Prostate cancer Paternal Grandfather        dx 30s   Leukemia Half-Brother 64   Colon cancer Half-Brother 80       Lynch+    Social History   Socioeconomic History   Marital status: Married    Spouse name: Not on file   Number of children: Not on file   Years of education: Not on file   Highest education level: Not on file  Occupational History   Not on file  Tobacco Use   Smoking status: Every Day    Current packs/day: 0.00    Types: Cigarettes    Start date: 11/28/2006    Last attempt to quit: 11/28/2016    Years since quitting: 7.9   Smokeless tobacco: Never   Tobacco comments:    1/2 pack-1 pack daily  Vaping Use   Vaping status: Never Used  Substance and Sexual Activity   Alcohol use: No   Drug use: No   Sexual activity: Not on file  Other Topics Concern   Not on file  Social History Narrative   Not on file   Social Drivers of Health   Tobacco Use: High Risk (10/31/2024)   Patient History    Smoking Tobacco Use: Every Day    Smokeless Tobacco Use: Never    Passive Exposure: Not on file  Financial Resource Strain: Not on file  Food Insecurity: Not on file  Transportation Needs: Not on file  Physical Activity: Not on file  Stress: Not on file  Social Connections: Not on file  Intimate Partner Violence: Not on file  Depression (PHQ2-9): Low Risk (05/07/2024)   Depression (PHQ2-9)    PHQ-2 Score: 0  Alcohol Screen: Low Risk (03/28/2022)   Alcohol Screen    Last Alcohol Screening Score (AUDIT): 0  Housing: Not on file  Utilities: Not on file  Health Literacy: Not on file      Review of Systems  Constitutional: Negative.  Negative for activity change, appetite change, chills, fatigue, fever and unexpected weight change.  HENT: Negative.  Negative for congestion, ear pain, rhinorrhea, sore throat and trouble swallowing.   Eyes: Negative.   Respiratory: Negative.  Negative for cough, chest tightness,  shortness of breath and wheezing.   Cardiovascular: Negative.  Negative for chest pain and palpitations.  Gastrointestinal: Negative.  Negative for abdominal pain, blood in stool, constipation, diarrhea, nausea and vomiting.  Endocrine: Negative.   Genitourinary: Negative.  Negative for difficulty urinating, dysuria, frequency, hematuria and urgency.  Musculoskeletal: Negative.  Negative for arthralgias, back pain, joint swelling, myalgias and neck pain.  Skin: Negative.  Negative for rash and wound.  Allergic/Immunologic: Negative.  Negative for immunocompromised state.  Neurological: Negative.  Negative for dizziness, seizures, numbness and headaches.  Hematological: Negative.   Psychiatric/Behavioral:  Positive for sleep disturbance. Negative for behavioral problems, self-injury and suicidal ideas. The patient is nervous/anxious.     Vital Signs: BP 126/78   Pulse 72   Temp 98.2 F (36.8 C)   Resp 16   Ht 5' 9 (1.753 m)   Wt 217 lb (98.4 kg)   SpO2 99%   BMI 32.05 kg/m    Physical Exam Vitals reviewed.  Constitutional:      General: He is not in acute distress.    Appearance: Normal appearance. He is obese. He is not ill-appearing.  HENT:     Head: Normocephalic and atraumatic.  Eyes:     Pupils: Pupils are equal, round, and reactive to light.  Cardiovascular:     Rate and Rhythm: Normal rate and regular rhythm.  Pulmonary:     Effort: Pulmonary effort is normal. No respiratory distress.  Neurological:     Mental Status: He is alert and oriented to person, place, and time.  Psychiatric:        Mood and Affect: Mood normal.        Behavior: Behavior normal.        Assessment/Plan: 1. Type 2 diabetes mellitus without complication, without long-term current use of insulin (HCC) (Primary) A1c is improved and stable. Continue diabetic diet and exercise as discussed.   2. Mixed hyperlipidemia Greatly improved, continue diet and exercise as previously discussed.  Continue working on weight loss.   3. Elevated ferritin Resolved, improved to normal range   4. Elevated liver enzymes Resolved, improved to normal range.   5. Vitamin D  deficiency Improving   6. GAD (generalized anxiety disorder) Has alprazolam  as needed, no refills needed at this time.    General Counseling: rayne loiseau understanding of the findings of todays visit and agrees with plan of treatment. I have discussed any further diagnostic evaluation that may be needed or ordered today. We also reviewed his medications today. he has been encouraged to call the office with any questions or concerns that should arise related to todays visit.  No orders of the defined types were placed in this encounter.   No orders of the defined types were placed in this encounter.   Return in 4 months (on 02/25/2025) for F/U, Recheck A1C, Alaisa Moffitt PCP.   Total time spent:30 Minutes Time spent includes review of chart, medications, test results, and follow up plan with the patient.   [place] Controlled Substance Database was reviewed by me.  This patient was seen by Mardy Maxin, FNP-C in collaboration with Dr. Sigrid Bathe as a part of collaborative care agreement.   Dwight Adamczak R. Maxin, MSN, FNP-C Internal medicine

## 2024-10-31 ENCOUNTER — Encounter: Payer: Self-pay | Admitting: Nurse Practitioner

## 2024-11-02 ENCOUNTER — Telehealth: Payer: Self-pay

## 2024-11-02 ENCOUNTER — Other Ambulatory Visit: Payer: Self-pay

## 2024-11-02 MED ORDER — OSELTAMIVIR PHOSPHATE 75 MG PO CAPS: 75.0000 mg | ORAL_CAPSULE | Freq: Two times a day (BID) | ORAL | 0 refills | Status: AC

## 2024-11-02 NOTE — Telephone Encounter (Signed)
 Pt called that he is exposure to flu his symptoms start yesterday coughing,wheezing and body aches as per alyssa sent Tamiflu  and advised to take OTC for cough

## 2024-11-05 ENCOUNTER — Telehealth: Payer: Self-pay

## 2024-11-05 ENCOUNTER — Other Ambulatory Visit: Payer: Self-pay

## 2024-11-05 MED ORDER — PREDNISONE 10 MG PO TABS: ORAL_TABLET | ORAL | 0 refills | Status: AC

## 2024-11-05 NOTE — Telephone Encounter (Signed)
 As per alyssa sent prednisone  6 days taper advised if not feeling better need appt or go to urgent care

## 2024-11-13 ENCOUNTER — Other Ambulatory Visit: Payer: Self-pay | Admitting: Nurse Practitioner

## 2024-11-13 DIAGNOSIS — R5383 Other fatigue: Secondary | ICD-10-CM

## 2024-11-13 DIAGNOSIS — E559 Vitamin D deficiency, unspecified: Secondary | ICD-10-CM

## 2024-11-13 DIAGNOSIS — G4710 Hypersomnia, unspecified: Secondary | ICD-10-CM

## 2024-11-13 DIAGNOSIS — E782 Mixed hyperlipidemia: Secondary | ICD-10-CM

## 2024-11-13 DIAGNOSIS — F411 Generalized anxiety disorder: Secondary | ICD-10-CM

## 2024-11-13 DIAGNOSIS — Z0001 Encounter for general adult medical examination with abnormal findings: Secondary | ICD-10-CM

## 2024-11-13 DIAGNOSIS — J301 Allergic rhinitis due to pollen: Secondary | ICD-10-CM

## 2024-11-13 DIAGNOSIS — E538 Deficiency of other specified B group vitamins: Secondary | ICD-10-CM

## 2025-02-25 ENCOUNTER — Ambulatory Visit: Admitting: Nurse Practitioner

## 2025-05-10 ENCOUNTER — Encounter: Admitting: Nurse Practitioner
